# Patient Record
Sex: Female | Born: 1995 | Race: Black or African American | Hispanic: No | Marital: Single | Smoking: Never smoker
Health system: Southern US, Community
[De-identification: ages and names within clinical notes are randomized; demographics above are authoritative.]

## PROBLEM LIST (undated history)

## (undated) ENCOUNTER — Inpatient Hospital Stay (HOSPITAL_COMMUNITY): Payer: Self-pay

## (undated) DIAGNOSIS — E669 Obesity, unspecified: Secondary | ICD-10-CM

## (undated) DIAGNOSIS — J45909 Unspecified asthma, uncomplicated: Secondary | ICD-10-CM

## (undated) DIAGNOSIS — K219 Gastro-esophageal reflux disease without esophagitis: Secondary | ICD-10-CM

## (undated) DIAGNOSIS — IMO0002 Reserved for concepts with insufficient information to code with codable children: Secondary | ICD-10-CM

## (undated) DIAGNOSIS — K6289 Other specified diseases of anus and rectum: Secondary | ICD-10-CM

## (undated) DIAGNOSIS — E282 Polycystic ovarian syndrome: Secondary | ICD-10-CM

## (undated) DIAGNOSIS — K649 Unspecified hemorrhoids: Secondary | ICD-10-CM

## (undated) HISTORY — DX: Unspecified hemorrhoids: K64.9

## (undated) HISTORY — DX: Other specified diseases of anus and rectum: K62.89

## (undated) HISTORY — PX: NO PAST SURGERIES: SHX2092

## (undated) HISTORY — DX: Polycystic ovarian syndrome: E28.2

---
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
## 2014-05-04 ENCOUNTER — Encounter (HOSPITAL_COMMUNITY): Payer: Self-pay | Admitting: Emergency Medicine

## 2014-05-04 ENCOUNTER — Emergency Department (HOSPITAL_COMMUNITY)
Admission: EM | Admit: 2014-05-04 | Discharge: 2014-05-05 | Disposition: A | Discharge status: Home / Self Care | Payer: Medicaid Other | Attending: Emergency Medicine | Admitting: Emergency Medicine

## 2014-05-04 DIAGNOSIS — R110 Nausea: Secondary | ICD-10-CM | POA: Diagnosis not present

## 2014-05-04 DIAGNOSIS — J45909 Unspecified asthma, uncomplicated: Secondary | ICD-10-CM | POA: Insufficient documentation

## 2014-05-04 DIAGNOSIS — E669 Obesity, unspecified: Secondary | ICD-10-CM | POA: Diagnosis not present

## 2014-05-04 DIAGNOSIS — R1011 Right upper quadrant pain: Secondary | ICD-10-CM | POA: Insufficient documentation

## 2014-05-04 DIAGNOSIS — Z8719 Personal history of other diseases of the digestive system: Secondary | ICD-10-CM | POA: Insufficient documentation

## 2014-05-04 DIAGNOSIS — Z3201 Encounter for pregnancy test, result positive: Secondary | ICD-10-CM | POA: Insufficient documentation

## 2014-05-04 DIAGNOSIS — Z3490 Encounter for supervision of normal pregnancy, unspecified, unspecified trimester: Secondary | ICD-10-CM

## 2014-05-04 DIAGNOSIS — R1030 Lower abdominal pain, unspecified: Secondary | ICD-10-CM

## 2014-05-04 DIAGNOSIS — R1032 Left lower quadrant pain: Secondary | ICD-10-CM | POA: Insufficient documentation

## 2014-05-04 NOTE — ED Notes (Signed)
Pt is c/o lower abd pain that she states comes and goes  Pt states she had episodes of vomiting last week  Pt states she has felt like she needed to have bowel movements today but was only able to go 3 out of 6 times  Pt states the only time she feels relief is when she is sitting on the toilet  Pt states her stool is formed but only passing little bits at a time

## 2014-05-05 ENCOUNTER — Emergency Department (HOSPITAL_COMMUNITY): Payer: Medicaid Other

## 2014-05-05 LAB — URINALYSIS, ROUTINE W REFLEX MICROSCOPIC
BILIRUBIN URINE: NEGATIVE
GLUCOSE, UA: NEGATIVE mg/dL
Ketones, ur: NEGATIVE mg/dL
Leukocytes, UA: NEGATIVE
Nitrite: NEGATIVE
PH: 6 (ref 5.0–8.0)
Protein, ur: NEGATIVE mg/dL
SPECIFIC GRAVITY, URINE: 1.014 (ref 1.005–1.030)
Urobilinogen, UA: 0.2 mg/dL (ref 0.0–1.0)

## 2014-05-05 LAB — POC URINE PREG, ED: Preg Test, Ur: POSITIVE — AB

## 2014-05-05 LAB — WET PREP, GENITAL
CLUE CELLS WET PREP: NONE SEEN
TRICH WET PREP: NONE SEEN
Yeast Wet Prep HPF POC: NONE SEEN

## 2014-05-05 LAB — URINE MICROSCOPIC-ADD ON

## 2014-05-05 LAB — I-STAT BETA HCG BLOOD, ED (MC, WL, AP ONLY): I-stat hCG, quantitative: 147.6 m[IU]/mL — ABNORMAL HIGH (ref <5)

## 2014-05-05 MED ORDER — ACETAMINOPHEN 500 MG PO TABS
1000.0000 mg | ORAL_TABLET | Freq: Once | ORAL | Status: AC
  Administered 2014-05-05: 1000 mg via ORAL
  Filled 2014-05-05: qty 2

## 2014-05-05 NOTE — Discharge Instructions (Signed)
Abdominal Pain During Pregnancy °Belly (abdominal) pain is common during pregnancy. Most of the time, it is not a serious problem. Other times, it can be a sign that something is wrong with the pregnancy. Always tell your doctor if you have belly pain. °HOME CARE °Monitor your belly pain for any changes. The following actions may help you feel better: °· Do not have sex (intercourse) or put anything in your vagina until you feel better. °· Rest until your pain stops. °· Drink clear fluids if you feel sick to your stomach (nauseous). Do not eat solid food until you feel better. °· Only take medicine as told by your doctor. °· Keep all doctor visits as told. °GET HELP RIGHT AWAY IF:  °· You are bleeding, leaking fluid, or pieces of tissue come out of your vagina. °· You have more pain or cramping. °· You keep throwing up (vomiting). °· You have pain when you pee (urinate) or have blood in your pee. °· You have a fever. °· You do not feel your baby moving as much. °· You feel very weak or feel like passing out. °· You have trouble breathing, with or without belly pain. °· You have a very bad headache and belly pain. °· You have fluid leaking from your vagina and belly pain. °· You keep having watery poop (diarrhea). °· Your belly pain does not go away after resting, or the pain gets worse. °MAKE SURE YOU:  °· Understand these instructions. °· Will watch your condition. °· Will get help right away if you are not doing well or get worse. °Document Released: 05/15/2009 Document Revised: 01/27/2013 Document Reviewed: 12/24/2012 °ExitCare® Patient Information ©2015 [employer]. This information is not intended to replace advice given to you by your health care provider. Make sure you discuss any questions you have with your health care provider. ° °

## 2014-05-05 NOTE — ED Provider Notes (Signed)
CSN: [id number]     Arrival date & time 05/04/14  2330 History   First MD Initiated Contact with Patient 05/04/14 2344     Chief Complaint  Patient presents with  . Abdominal Pain     (Consider location/radiation/quality/duration/timing/severity/associated sxs/prior Treatment) Patient is a 18 y.o. female presenting with abdominal pain. The history is provided by the patient.  Abdominal Pain Pain location:  LLQ, RLQ and suprapubic Pain quality: cramping   Pain quality: no fullness and not gnawing   Pain radiates to:  Does not radiate Pain severity:  Mild Onset quality:  Gradual Duration:  1 week Timing:  Intermittent Progression:  Worsening Chronicity:  New Context: recent sexual activity   Context: not diet changes, not eating and not sick contacts   Relieved by: sitting on toilet. Worsened by:  Nothing tried Associated symptoms: nausea   Associated symptoms: no chest pain, no cough, no diarrhea, no fever, no shortness of breath and no vomiting     Past Medical History  Diagnosis Date  . Anal or rectal pain   . Hemorrhoids   . Asthma   . Obesity    History reviewed. No pertinent past surgical history. Family History  Problem Relation Age of Onset  . Hypertension Mother   . Hypertension Father   . Diabetes Father   . Hypertension Other   . Diabetes Other   . Cancer Other   . Heart failure Other    History  Substance Use Topics  . Smoking status: Never Smoker   . Smokeless tobacco: Never Used  . Alcohol Use: No   OB History    No data available     Review of Systems  Constitutional: Negative for fever.  Respiratory: Negative for cough and shortness of breath.   Cardiovascular: Negative for chest pain and leg swelling.  Gastrointestinal: Positive for nausea and abdominal pain. Negative for vomiting and diarrhea.  All other systems reviewed and are negative.     Allergies  Food  Home Medications   Prior to Admission medications   Medication Sig  Start Date End Date Taking? Authorizing Provider  ibuprofen (ADVIL,MOTRIN) 200 MG tablet Take 200 mg by mouth every 6 (six) hours as needed for moderate pain.   Yes Historical Provider, MD   BP 133/66 mmHg  Pulse 79  Temp(Src) 98.5 F (36.9 C) (Oral)  Resp 20  Ht 6' (1.829 m)  Wt 364 lb (165.109 kg)  BMI 49.36 kg/m2  SpO2 93%  LMP 03/19/2014 (Approximate) Physical Exam  Constitutional: She is oriented to person, place, and time. She appears well-developed and well-nourished. No distress.  HENT:  Head: Normocephalic and atraumatic.  Mouth/Throat: Oropharynx is clear and moist.  Eyes: EOM are normal. Pupils are equal, round, and reactive to light.  Neck: Normal range of motion. Neck supple.  Cardiovascular: Normal rate and regular rhythm.  Exam reveals no friction rub.   No murmur heard. Pulmonary/Chest: Effort normal and breath sounds normal. No respiratory distress. She has no wheezes. She has no rales.  Abdominal: Soft. She exhibits no distension. There is tenderness (mild, bilateral lower quadrants). There is no rebound.  Musculoskeletal: Normal range of motion. She exhibits no edema.  Neurological: She is alert and oriented to person, place, and time. No cranial nerve deficit. She exhibits normal muscle tone. Coordination normal.  Skin: No rash noted. She is not diaphoretic.  Nursing note and vitals reviewed.   ED Course  Procedures (including critical care time) Labs Review Labs Reviewed  WET PREP, GENITAL  GC/CHLAMYDIA PROBE AMP  URINALYSIS, ROUTINE W REFLEX MICROSCOPIC    Imaging Review [place] Ob Comp Less 14 Wks  05/05/2014   CLINICAL DATA:  Positive pregnancy test.  Lower abdominal pain.  EXAM: OBSTETRIC <14 WK US AND TRANSVAGINAL OB US  TECHNIQUE: Both transabdominal and transvaginal ultrasound examinations were performed for complete evaluation of the gestation as well as the maternal uterus, adnexal regions, and pelvic cul-de-sac. Transvaginal technique was performed  to assess early pregnancy.  COMPARISON:  None.  FINDINGS: Intrauterine gestational sac: None  Yolk sac:  No  Embryo:  No  Cardiac Activity: No  Heart Rate:  No bpm  Maternal uterus/adnexae:  Subchorionic hemorrhage: None  Right ovary: Normal  Left ovary: Normal  Other: Endometrium appears thickened measuring 24.7 mm. Mixed echogenicity and appearance.  Free fluid: :Moderate amount of free fluid identified within the pelvis.  IMPRESSION: 1. No intrauterine gestational sac, yolk sac, or fetal pole identified. Differential considerations include intrauterine pregnancy too early to be sonographically visualized, missed abortion, or ectopic pregnancy. Followup ultrasound is recommended in 10-14 days for further evaluation. 2. Abnormal thickening of the endometrium. In the setting of missed abortion this could represent retained products. Attention on followup imaging is advised. 3. Moderate free fluid noted within the pelvis.   Electronically Signed   By: Signa Kellaylor  Stroud M.D.   On: 05/05/2014 02:42   [place] Ob Transvaginal  05/05/2014   CLINICAL DATA:  Positive pregnancy test.  Lower abdominal pain.  EXAM: OBSTETRIC <14 WK US AND TRANSVAGINAL OB US  TECHNIQUE: Both transabdominal and transvaginal ultrasound examinations were performed for complete evaluation of the gestation as well as the maternal uterus, adnexal regions, and pelvic cul-de-sac. Transvaginal technique was performed to assess early pregnancy.  COMPARISON:  None.  FINDINGS: Intrauterine gestational sac: None  Yolk sac:  No  Embryo:  No  Cardiac Activity: No  Heart Rate:  No bpm  Maternal uterus/adnexae:  Subchorionic hemorrhage: None  Right ovary: Normal  Left ovary: Normal  Other: Endometrium appears thickened measuring 24.7 mm. Mixed echogenicity and appearance.  Free fluid: :Moderate amount of free fluid identified within the pelvis.  IMPRESSION: 1. No intrauterine gestational sac, yolk sac, or fetal pole identified. Differential considerations include  intrauterine pregnancy too early to be sonographically visualized, missed abortion, or ectopic pregnancy. Followup ultrasound is recommended in 10-14 days for further evaluation. 2. Abnormal thickening of the endometrium. In the setting of missed abortion this could represent retained products. Attention on followup imaging is advised. 3. Moderate free fluid noted within the pelvis.   Electronically Signed   By: Signa Kellaylor  Stroud M.D.   On: 05/05/2014 02:42     EKG Interpretation None      MDM   Final diagnoses:  Lower abdominal pain  Pregnancy    41F here with abdominal pain. Lower abdominal pain, cramping. Better with sitting down on the toilet. Doesn't always have a BM when she feels like she has to go, which is about 6x per day. No exacerbating factors. States urinary frequency, but no vaginal discharge/bleeding. LMP 1 month ago, is 1 week late at this time. No fevers, no vomiting. Was nauseated for past week. Is getting over a yeast infection. On exam, mild bilateral lower abdominal tenderness. Will start with pelvic and urine studies.  Tender diffusely on pelvic. Will US. Urine studies show she is pregnant. Beta HCG lower than threshold to see an IUP. Instructed to f/u with repeat hCG in 2 days.  Elwin Mocha, MD 05/05/14 0700

## 2014-05-05 NOTE — ED Notes (Signed)
Witnessed bedside ultrasound.

## 2014-05-05 NOTE — ED Notes (Signed)
Patient reports intermittent abdominal pain for a week.  States she has had some trouble with bowel movements today.  She says she had the urge to go at least 6 times, but only produced small amounts of stool 3 of those times.

## 2014-05-06 LAB — GC/CHLAMYDIA PROBE AMP
CT PROBE, AMP APTIMA: POSITIVE — AB
GC PROBE AMP APTIMA: NEGATIVE

## 2014-05-07 ENCOUNTER — Telehealth: Payer: Self-pay | Admitting: Emergency Medicine

## 2014-05-07 NOTE — Telephone Encounter (Signed)
Positive Chlamydia culture Chart sent to EDP for review 

## 2014-05-14 ENCOUNTER — Telehealth (HOSPITAL_BASED_OUTPATIENT_CLINIC_OR_DEPARTMENT_OTHER): Payer: Self-pay | Admitting: *Deleted

## 2014-06-06 LAB — OB RESULTS CONSOLE RPR: RPR: NONREACTIVE

## 2014-06-06 LAB — OB RESULTS CONSOLE RUBELLA ANTIBODY, IGM: Rubella: IMMUNE

## 2014-06-06 LAB — OB RESULTS CONSOLE GBS: STREP GROUP B AG: POSITIVE

## 2014-06-06 LAB — OB RESULTS CONSOLE HIV ANTIBODY (ROUTINE TESTING): HIV: NONREACTIVE

## 2014-06-06 LAB — OB RESULTS CONSOLE ANTIBODY SCREEN: ANTIBODY SCREEN: NEGATIVE

## 2014-06-06 LAB — OB RESULTS CONSOLE GC/CHLAMYDIA
Chlamydia: NEGATIVE
Gonorrhea: NEGATIVE

## 2014-06-06 LAB — OB RESULTS CONSOLE ABO/RH: RH TYPE: POSITIVE

## 2014-06-06 LAB — OB RESULTS CONSOLE HEPATITIS B SURFACE ANTIGEN: Hepatitis B Surface Ag: NEGATIVE

## 2014-06-10 NOTE — L&D Delivery Note (Signed)
Delivery Note At 5:22 PM a viable and healthy female was delivered via Vaginal, Spontaneous Delivery (Presentation: Right Occiput Anterior).  APGAR: 9, 9; weight P .   Placenta status: Intact, Spontaneous.  Cord: 3 vessels with the following complications: None.    Anesthesia: Epidural  Episiotomy:  none Lacerations:  L labial Suture Repair: 3.0 vicryl rapide Est. Blood Loss (mL):  352cc  Mom to postpartum.  Baby to Couplet care / Skin to Skin.  Bovard-Stuckert, Rhonda Hall 01/05/2015, 5:51 PM  Br/Bo; [place]; Tdap in [facility], O+; Contra POPs  Baby: Rhonda Hall

## 2014-06-18 ENCOUNTER — Encounter (HOSPITAL_COMMUNITY): Payer: Self-pay | Admitting: *Deleted

## 2014-06-18 ENCOUNTER — Inpatient Hospital Stay (HOSPITAL_COMMUNITY)
Admission: AD | Admit: 2014-06-18 | Discharge: 2014-06-18 | Disposition: A | Discharge status: Home / Self Care | Payer: Medicaid Other | Source: Ambulatory Visit | Attending: Obstetrics and Gynecology | Admitting: Obstetrics and Gynecology

## 2014-06-18 DIAGNOSIS — R42 Dizziness and giddiness: Secondary | ICD-10-CM

## 2014-06-18 DIAGNOSIS — Z3A11 11 weeks gestation of pregnancy: Secondary | ICD-10-CM | POA: Insufficient documentation

## 2014-06-18 DIAGNOSIS — O210 Mild hyperemesis gravidarum: Secondary | ICD-10-CM | POA: Insufficient documentation

## 2014-06-18 DIAGNOSIS — O219 Vomiting of pregnancy, unspecified: Secondary | ICD-10-CM

## 2014-06-18 LAB — URINALYSIS, ROUTINE W REFLEX MICROSCOPIC
Bilirubin Urine: NEGATIVE
GLUCOSE, UA: NEGATIVE mg/dL
HGB URINE DIPSTICK: NEGATIVE
Ketones, ur: NEGATIVE mg/dL
Nitrite: NEGATIVE
PH: 6.5 (ref 5.0–8.0)
Protein, ur: NEGATIVE mg/dL
Specific Gravity, Urine: 1.025 (ref 1.005–1.030)
UROBILINOGEN UA: 0.2 mg/dL (ref 0.0–1.0)

## 2014-06-18 LAB — COMPREHENSIVE METABOLIC PANEL
ALT: 15 U/L (ref 0–35)
AST: 17 U/L (ref 0–37)
Albumin: 3.2 g/dL — ABNORMAL LOW (ref 3.5–5.2)
Alkaline Phosphatase: 52 U/L (ref 39–117)
Anion gap: 7 (ref 5–15)
BUN: 8 mg/dL (ref 6–23)
CALCIUM: 8.8 mg/dL (ref 8.4–10.5)
CHLORIDE: 106 meq/L (ref 96–112)
CO2: 23 mmol/L (ref 19–32)
Creatinine, Ser: 0.54 mg/dL (ref 0.50–1.10)
GFR calc non Af Amer: >90 mL/min (ref >90)
GLUCOSE: 110 mg/dL — AB (ref 70–99)
POTASSIUM: 3.7 mmol/L (ref 3.5–5.1)
Sodium: 136 mmol/L (ref 135–145)
TOTAL PROTEIN: 6.5 g/dL (ref 6.0–8.3)
Total Bilirubin: 0.4 mg/dL (ref 0.3–1.2)

## 2014-06-18 LAB — CBC
HCT: 37 % (ref 36.0–46.0)
Hemoglobin: 11.8 g/dL — ABNORMAL LOW (ref 12.0–15.0)
MCH: 27.9 pg (ref 26.0–34.0)
MCHC: 31.9 g/dL (ref 30.0–36.0)
MCV: 87.5 fL (ref 78.0–100.0)
Platelets: 242 10*3/uL (ref 150–400)
RBC: 4.23 MIL/uL (ref 3.87–5.11)
RDW: 15.4 % (ref 11.5–15.5)
WBC: 9.1 10*3/uL (ref 4.0–10.5)

## 2014-06-18 LAB — URINE MICROSCOPIC-ADD ON

## 2014-06-18 LAB — POCT PREGNANCY, URINE: Preg Test, Ur: POSITIVE — AB

## 2014-06-18 MED ORDER — PROMETHAZINE HCL 25 MG PO TABS
25.0000 mg | ORAL_TABLET | Freq: Once | ORAL | Status: AC
  Administered 2014-06-18: 25 mg via ORAL
  Filled 2014-06-18: qty 1

## 2014-06-18 MED ORDER — PROMETHAZINE HCL 25 MG PO TABS: 25.0000 mg | ORAL_TABLET | Freq: Four times a day (QID) | ORAL | Status: DC | PRN

## 2014-06-18 NOTE — MAU Provider Note (Signed)
History     CSN: [id number]  Arrival date and time: 06/18/14 1255   First Provider Initiated Contact with Patient 06/18/14 1327      No chief complaint on file.  HPI Comments: Rhonda Hall 19 y.o. G1P0 5815w0d presents to MAU via EMS due to dizzy spell. She originally  felt it was due to low blood sugar but her mother brought her food and that did not make her better. She does have nausea, no vomiting. She has had an ultrasound in the office and everything is fine with pregnancy.      Past Medical History  Diagnosis Date  . Anal or rectal pain   . Hemorrhoids   . Asthma   . Obesity     Past Surgical History  Procedure Laterality Date  . No past surgeries      Family History  Problem Relation Age of Onset  . Hypertension Mother   . Hypertension Father   . Diabetes Father   . Hypertension Other   . Diabetes Other   . Cancer Other   . Heart failure Other     History  Substance Use Topics  . Smoking status: Never Smoker   . Smokeless tobacco: Never Used  . Alcohol Use: No    Allergies:  Allergies  Allergen Reactions  . Food Hives and Other (See Comments)    Pt is allergic to strawberries    Prescriptions prior to admission  Medication Sig Dispense Refill Last Dose  . amoxicillin (AMOXIL) 500 MG capsule Take 500 mg by mouth 3 (three) times daily.   06/17/2014 at Unknown time  . omeprazole (PRILOSEC OTC) 20 MG tablet Take 20 mg by mouth daily.   Past Week at Unknown time  . Prenatal Vit-Fe Fumarate-FA (MULTIVITAMIN-PRENATAL) 27-0.8 MG TABS tablet Take 1 tablet by mouth daily.    06/17/2014 at Unknown time  . ibuprofen (ADVIL,MOTRIN) 200 MG tablet Take 200 mg by mouth every 6 (six) hours as needed for moderate pain.   PRN    Review of Systems  Respiratory: Negative.   Cardiovascular: Negative.   Gastrointestinal: Positive for nausea. Negative for vomiting.  Genitourinary: Negative.   Musculoskeletal: Negative.   Neurological: Positive for dizziness and  weakness.   Physical Exam   Blood pressure 134/60, pulse 92, temperature 97.9 F (36.6 C), temperature source Oral, resp. rate 18, last menstrual period 04/02/2014.  Physical Exam  Constitutional: She is oriented to person, place, and time. She appears well-developed and well-nourished. No distress.  obese  HENT:  Head: Normocephalic and atraumatic.  Eyes: Pupils are equal, round, and reactive to light.  Cardiovascular: Normal rate, regular rhythm and normal heart sounds.   Respiratory: Effort normal and breath sounds normal. No respiratory distress. She has no wheezes. She has no rales.  GI: Soft. Bowel sounds are normal. She exhibits no distension. There is no tenderness. There is no rebound and no guarding.  Musculoskeletal: Normal range of motion.  Neurological: She is alert and oriented to person, place, and time.  Skin: Skin is warm and dry.  Psychiatric: She has a normal mood and affect. Her behavior is normal. Judgment and thought content normal.   Results for orders placed or performed during the hospital encounter of 06/18/14 (from the past 24 hour(s))  Urinalysis, Routine w reflex microscopic     Status: Abnormal   Collection Time: 06/18/14  1:10 PM  Result Value Ref Range   Color, Urine YELLOW YELLOW   APPearance CLEAR CLEAR  Specific Gravity, Urine 1.025 1.005 - 1.030   pH 6.5 5.0 - 8.0   Glucose, UA NEGATIVE NEGATIVE mg/dL   Hgb urine dipstick NEGATIVE NEGATIVE   Bilirubin Urine NEGATIVE NEGATIVE   Ketones, ur NEGATIVE NEGATIVE mg/dL   Protein, ur NEGATIVE NEGATIVE mg/dL   Urobilinogen, UA 0.2 0.0 - 1.0 mg/dL   Nitrite NEGATIVE NEGATIVE   Leukocytes, UA SMALL (A) NEGATIVE  Urine microscopic-add on     Status: Abnormal   Collection Time: 06/18/14  1:10 PM  Result Value Ref Range   Squamous Epithelial / LPF MANY (A) RARE   WBC, UA 3-6 <3 WBC/hpf   Bacteria, UA MANY (A) RARE   Urine-Other MUCOUS PRESENT   Pregnancy, urine POC     Status: Abnormal   Collection  Time: 06/18/14  1:33 PM  Result Value Ref Range   Preg Test, Ur POSITIVE (A) NEGATIVE  CBC     Status: Abnormal   Collection Time: 06/18/14  1:48 PM  Result Value Ref Range   WBC 9.1 4.0 - 10.5 K/uL   RBC 4.23 3.87 - 5.11 MIL/uL   Hemoglobin 11.8 (L) 12.0 - 15.0 g/dL   HCT 16.1 09.6 - 04.5 %   MCV 87.5 78.0 - 100.0 fL   MCH 27.9 26.0 - 34.0 pg   MCHC 31.9 30.0 - 36.0 g/dL   RDW 40.9 81.1 - 91.4 %   Platelets 242 150 - 400 K/uL  Comprehensive metabolic panel     Status: Abnormal   Collection Time: 06/18/14  1:48 PM  Result Value Ref Range   Sodium 136 135 - 145 mmol/L   Potassium 3.7 3.5 - 5.1 mmol/L   Chloride 106 96 - 112 mEq/L   CO2 23 19 - 32 mmol/L   Glucose, Bld 110 (H) 70 - 99 mg/dL   BUN 8 6 - 23 mg/dL   Creatinine, Ser 7.82 0.50 - 1.10 mg/dL   Calcium 8.8 8.4 - 95.6 mg/dL   Total Protein 6.5 6.0 - 8.3 g/dL   Albumin 3.2 (L) 3.5 - 5.2 g/dL   AST 17 0 - 37 U/L   ALT 15 0 - 35 U/L   Alkaline Phosphatase 52 39 - 117 U/L   Total Bilirubin 0.4 0.3 - 1.2 mg/dL   GFR calc non Af Amer >90 >90 mL/min   GFR calc Af Amer >90 >90 mL/min   Anion gap 7 5 - 15     MAU Course  Procedures  MDM CBC, CMET, urine culture Phenergan 25 mg po Reviewed case with Dr Ambrose Mantle who felt it was fine to send her home with phenergan Informal bedside ultrasound done due to obesity +FHT at 167  Assessment and Plan   A: Vertigo in early Pregnancy Nausea in pregnancy  P: Above orders Reviewed meal schedule to include 6 small meals per day/ protein in each meal Advised to consider seeing nutritionist Phenergan 25 mg po q8 hours prn nausea Follow up with Dr Danny Lawless as needed Return to MAU as needed  Carolynn Serve 06/18/2014, 2:06 PM

## 2014-06-18 NOTE — Discharge Instructions (Signed)
Dizziness °Dizziness is a common problem. It is a feeling of unsteadiness or light-headedness. You may feel like you are about to faint. Dizziness can lead to injury if you stumble or fall. A person of any age group can suffer from dizziness, but dizziness is more common in older adults. °CAUSES  °Dizziness can be caused by many different things, including: °· Middle ear problems. °· Standing for too long. °· Infections. °· An allergic reaction. °· Aging. °· An emotional response to something, such as the sight of blood. °· Side effects of medicines. °· Tiredness. °· Problems with circulation or blood pressure. °· Excessive use of alcohol or medicines, or illegal drug use. °· Breathing too fast (hyperventilation). °· An irregular heart rhythm (arrhythmia). °· A low red blood cell count (anemia). °· Pregnancy. °· Vomiting, diarrhea, fever, or other illnesses that cause body fluid loss (dehydration). °· Diseases or conditions such as Parkinson's disease, high blood pressure (hypertension), diabetes, and thyroid problems. °· Exposure to extreme heat. °DIAGNOSIS  °Your health care provider will ask about your symptoms, perform a physical exam, and perform an electrocardiogram (ECG) to record the electrical activity of your heart. Your health care provider may also perform other heart or blood tests to determine the cause of your dizziness. These may include: °· Transthoracic echocardiogram (TTE). During echocardiography, sound waves are used to evaluate how blood flows through your heart. °· Transesophageal echocardiogram (TEE). °· Cardiac monitoring. This allows your health care provider to monitor your heart rate and rhythm in real time. °· Holter monitor. This is a portable device that records your heartbeat and can help diagnose heart arrhythmias. It allows your health care provider to track your heart activity for several days if needed. °· Stress tests by exercise or by giving medicine that makes the heart beat  faster. °TREATMENT  °Treatment of dizziness depends on the cause of your symptoms and can vary greatly. °HOME CARE INSTRUCTIONS  °· Drink enough fluids to keep your urine clear or pale yellow. This is especially important in very hot weather. In older adults, it is also important in cold weather. °· Take your medicine exactly as directed if your dizziness is caused by medicines. When taking blood pressure medicines, it is especially important to get up slowly. °· Rise slowly from chairs and steady yourself until you feel okay. °· In the morning, first sit up on the side of the bed. When you feel okay, stand slowly while holding onto something until you know your balance is fine. °· Move your legs often if you need to stand in one place for a long time. Tighten and relax your muscles in your legs while standing. °· Have someone stay with you for 1-2 days if dizziness continues to be a problem. Do this until you feel you are well enough to stay alone. Have the person call your health care provider if he or she notices changes in you that are concerning. °· Do not drive or use heavy machinery if you feel dizzy. °· Do not drink alcohol. °SEEK IMMEDIATE MEDICAL CARE IF:  °· Your dizziness or light-headedness gets worse. °· You feel nauseous or vomit. °· You have problems talking, walking, or using your arms, hands, or legs. °· You feel weak. °· You are not thinking clearly or you have trouble forming sentences. It may take a friend or family member to notice this. °· You have chest pain, abdominal pain, shortness of breath, or sweating. °· Your vision changes. °· You notice   any bleeding.  You have side effects from medicine that seems to be getting worse rather than better. MAKE SURE YOU:   Understand these instructions.  Will watch your condition.  Will get help right away if you are not doing well or get worse. Document Released: 11/20/2000 Document Revised: 06/01/2013 Document Reviewed: 12/14/2010 [facility]  Patient Information 2015 [employer], [place]. This information is not intended to replace advice given to you by your health care provider. Make sure you discuss any questions you have with your health care provider. Morning Sickness Morning sickness is when you feel sick to your stomach (nauseous) during pregnancy. This nauseous feeling may or may not come with vomiting. It often occurs in the morning but can be a problem any time of day. Morning sickness is most common during the first trimester, but it may continue throughout pregnancy. While morning sickness is unpleasant, it is usually harmless unless you develop severe and continual vomiting (hyperemesis gravidarum). This condition requires more intense treatment.  CAUSES  The cause of morning sickness is not completely known but seems to be related to normal hormonal changes that occur in pregnancy. RISK FACTORS You are at greater risk if you:  Experienced nausea or vomiting before your pregnancy.  Had morning sickness during a previous pregnancy.  Are pregnant with more than one baby, such as twins. TREATMENT  Do not use any medicines (prescription, over-the-counter, or herbal) for morning sickness without first talking to your health care provider. Your health care provider may prescribe or recommend:  Vitamin B6 supplements.  Anti-nausea medicines.  The herbal medicine ginger. HOME CARE INSTRUCTIONS   Only take over-the-counter or prescription medicines as directed by your health care provider.  Taking multivitamins before getting pregnant can prevent or decrease the severity of morning sickness in most women.  Eat a piece of dry toast or unsalted crackers before getting out of bed in the morning.  Eat five or six small meals a day.  Eat dry and bland foods (rice, baked potato). Foods high in carbohydrates are often helpful.  Do not drink liquids with your meals. Drink liquids between meals.  Avoid greasy, fatty, and spicy  foods.  Get someone to cook for you if the smell of any food causes nausea and vomiting.  If you feel nauseous after taking prenatal vitamins, take the vitamins at night or with a snack.  Snack on protein foods (nuts, yogurt, cheese) between meals if you are hungry.  Eat unsweetened gelatins for desserts.  Wearing an acupressure wristband (worn for sea sickness) may be helpful.  Acupuncture may be helpful.  Do not smoke.  Get a humidifier to keep the air in your house free of odors.  Get plenty of fresh air. SEEK MEDICAL CARE IF:   Your home remedies are not working, and you need medicine.  You feel dizzy or lightheaded.  You are losing weight. SEEK IMMEDIATE MEDICAL CARE IF:   You have persistent and uncontrolled nausea and vomiting.  You pass out (faint). MAKE SURE YOU:  Understand these instructions.  Will watch your condition.  Will get help right away if you are not doing well or get worse. Document Released: 07/18/2006 Document Revised: 06/01/2013 Document Reviewed: 11/11/2012 [facility]Exit[facility] Patient Information 2015 [place], [place]. This information is not intended to replace advice given to you by your health care provider. Make sure you discuss any questions you have with your health care provider.

## 2014-06-18 NOTE — MAU Note (Signed)
Pt. States that this am she felt weak like her blood sugar was dropping so she layed down and ate. This did not help. Felt worse after eating. Pt. States legs are weak and feel like they are going to give out. Denies history of diabetes. Next appointment scheduled with OB on Jan 25 for a second ultrasound. Denies bleeding. Reports normal discharge. Denies cramping.

## 2014-06-20 LAB — CULTURE, OB URINE
Colony Count: 95000
SPECIAL REQUESTS: NORMAL

## 2014-07-14 ENCOUNTER — Inpatient Hospital Stay (HOSPITAL_COMMUNITY)
Admission: AD | Admit: 2014-07-14 | Discharge: 2014-07-14 | Disposition: A | Discharge status: Home / Self Care | Payer: Medicaid Other | Source: Ambulatory Visit | Attending: Obstetrics and Gynecology | Admitting: Obstetrics and Gynecology

## 2014-07-14 ENCOUNTER — Encounter (HOSPITAL_COMMUNITY): Payer: Self-pay | Admitting: *Deleted

## 2014-07-14 DIAGNOSIS — Z3A14 14 weeks gestation of pregnancy: Secondary | ICD-10-CM | POA: Diagnosis not present

## 2014-07-14 DIAGNOSIS — Z3492 Encounter for supervision of normal pregnancy, unspecified, second trimester: Secondary | ICD-10-CM

## 2014-07-14 DIAGNOSIS — O368120 Decreased fetal movements, second trimester, not applicable or unspecified: Secondary | ICD-10-CM | POA: Insufficient documentation

## 2014-07-14 HISTORY — DX: Gastro-esophageal reflux disease without esophagitis: K21.9

## 2014-07-14 NOTE — MAU Note (Signed)
Pt stated she has not felt baby move today. Denies any pain or cramping.

## 2014-07-14 NOTE — MAU Provider Note (Signed)
  History     CSN: [id number]  Arrival date and time: 07/14/14 [id number]   First Provider Initiated Contact with Patient 07/14/14 1950      Chief Complaint  Patient presents with  . Decreased Fetal Movement   HPI  Pt is a 19 yo G1P0 at 1342w2d wks IUP here with report of not feeling baby move today.  Denies vaginal bleeding or pelvic pain.  Last movement felt per patient was yesterday.    Past Medical History  Diagnosis Date  . Anal or rectal pain   . Hemorrhoids   . Asthma   . Obesity   . GERD (gastroesophageal reflux disease)     Past Surgical History  Procedure Laterality Date  . No past surgeries      Family History  Problem Relation Age of Onset  . Hypertension Mother   . Hypertension Father   . Diabetes Father   . Hypertension Other   . Diabetes Other   . Cancer Other   . Heart failure Other     History  Substance Use Topics  . Smoking status: Never Smoker   . Smokeless tobacco: Never Used  . Alcohol Use: No    Allergies:  Allergies  Allergen Reactions  . 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 Strawberry Hives    Prescriptions prior to admission  Medication Sig Dispense Refill Last Dose  . Prenatal Vit-Fe Fumarate-FA (MULTIVITAMIN-PRENATAL) 27-0.8 MG TABS tablet Take 1 tablet by mouth daily.    07/13/2014 at Unknown time  . promethazine (PHENERGAN) 25 MG tablet Take 1 tablet (25 mg total) by mouth every 6 (six) hours as needed for nausea or vomiting. 30 tablet 0 07/13/2014 at Unknown time  . ibuprofen (ADVIL,MOTRIN) 200 MG tablet Take 200 mg by mouth every 6 (six) hours as needed for moderate pain.   More than a month at Unknown time  . omeprazole (PRILOSEC OTC) 20 MG tablet Take 20 mg by mouth daily.   More than a month at Unknown time    Review of Systems  Constitutional:       Decreased fetal movement  All other systems reviewed and are negative.    Physical Exam   Blood pressure 131/66, pulse 89, temperature 98.6  F (37 C), temperature source Oral, resp. rate 18, height 6' (1.829 m), weight 175.202 kg (386 lb 4 oz), last menstrual period 04/02/2014, SpO2 100 %.  Physical Exam  Constitutional: She is oriented to person, place, and time. She appears well-developed and well-nourished. No distress.  HENT:  Head: Normocephalic.  Neck: Normal range of motion. Neck supple.  Cardiovascular: Normal rate, regular rhythm and normal heart sounds.   Respiratory: Effort normal and breath sounds normal.  GI: Soft. There is no tenderness.  Genitourinary: No bleeding in the vagina.  Neurological: She is alert and oriented to person, place, and time.  Skin: Skin is warm and dry.   FHR 140's MAU Course  Procedures   Assessment and Plan  19 yo G1P0 at 4642w2d wks IUP Reassuring Fetal Heart Tones  Plan: Discharge to home Provided reassurance Talked about the milestones in pregnancy - expected fluttering Keep scheduled appointment on 08/01/14.    Marlis EdelsonKARIM, Nikitas Davtyan N

## 2014-07-14 NOTE — MAU Note (Signed)
PT SAYS SHE   HAD BEEN FEELING  BABY MOVE-  BUT  NO MOVEMENT  TODAY.    NO VAG BLEEDING.PNC-   WITH DR BOVARD-  SEEN  ON 1-25  AND  NEXT APPOINTMENT   IS 2-22.  LAST SEX-    YESTERDAY.

## 2014-11-03 ENCOUNTER — Encounter (HOSPITAL_COMMUNITY): Payer: Self-pay | Admitting: Emergency Medicine

## 2014-11-03 ENCOUNTER — Emergency Department (HOSPITAL_COMMUNITY)
Admission: EM | Admit: 2014-11-03 | Discharge: 2014-11-03 | Disposition: A | Discharge status: Home / Self Care | Payer: Medicaid Other | Attending: Emergency Medicine | Admitting: Emergency Medicine

## 2014-11-03 DIAGNOSIS — J029 Acute pharyngitis, unspecified: Secondary | ICD-10-CM | POA: Diagnosis not present

## 2014-11-03 DIAGNOSIS — E669 Obesity, unspecified: Secondary | ICD-10-CM | POA: Diagnosis not present

## 2014-11-03 DIAGNOSIS — J45909 Unspecified asthma, uncomplicated: Secondary | ICD-10-CM | POA: Diagnosis not present

## 2014-11-03 DIAGNOSIS — Z79899 Other long term (current) drug therapy: Secondary | ICD-10-CM | POA: Insufficient documentation

## 2014-11-03 DIAGNOSIS — Z8719 Personal history of other diseases of the digestive system: Secondary | ICD-10-CM | POA: Diagnosis not present

## 2014-11-03 LAB — RAPID STREP SCREEN (MED CTR MEBANE ONLY): STREPTOCOCCUS, GROUP A SCREEN (DIRECT): NEGATIVE

## 2014-11-03 NOTE — ED Notes (Signed)
Pt states she woke up yesterday morning and her throat felt scratchy   Pt states today it is worse  Pt states it is painful to swallow  Pt's throat is red

## 2014-11-03 NOTE — ED Provider Notes (Signed)
CSN: [id number]     Arrival date & time 11/03/14  0217 History   First MD Initiated Contact with Patient 11/03/14 [phone]     Chief Complaint  Patient presents with  . Sore Throat     (Consider location/radiation/quality/duration/timing/severity/associated sxs/prior Treatment) Patient is a 19 y.o. female presenting with pharyngitis. The history is provided by the patient. No language interpreter was used.  Sore Throat This is a new problem. The current episode started today. Associated symptoms include a sore throat. Pertinent negatives include no chills, congestion, coughing or fever. Associated symptoms comments: Sore throat without fever for the past one day. No significant congestion, cough, or sinus symptoms. She reports brother and mother with similar symptoms. .    Past Medical History  Diagnosis Date  . Anal or rectal pain   . Hemorrhoids   . Asthma   . Obesity   . GERD (gastroesophageal reflux disease)    Past Surgical History  Procedure Laterality Date  . No past surgeries     Family History  Problem Relation Age of Onset  . Hypertension Mother   . Hypertension Father   . Diabetes Father   . Hypertension Other   . Diabetes Other   . Cancer Other   . Heart failure Other    History  Substance Use Topics  . Smoking status: Never Smoker   . Smokeless tobacco: Never Used  . Alcohol Use: No   OB History    Gravida Para Term Preterm AB TAB SAB Ectopic Multiple Living   1              Review of Systems  Constitutional: Negative for fever and chills.  HENT: Positive for sore throat. Negative for congestion and trouble swallowing.   Respiratory: Negative.  Negative for cough.   Cardiovascular: Negative.   Gastrointestinal: Negative.   Musculoskeletal: Negative.   Skin: Negative.       Allergies  Strawberry  Home Medications   Prior to Admission medications   Medication Sig Start Date End Date Taking? Authorizing Provider  acetaminophen (TYLENOL) 325 MG  tablet Take 650 mg by mouth every 6 (six) hours as needed for headache.   Yes Historical Provider, MD  docusate sodium (COLACE) 100 MG capsule Take 100 mg by mouth 2 (two) times daily as needed for mild constipation.   Yes Historical Provider, MD  Prenatal Vit-Fe Fumarate-FA (MULTIVITAMIN-PRENATAL) 27-0.8 MG TABS tablet Take 1 tablet by mouth daily.    Yes Historical Provider, MD  albuterol (PROVENTIL HFA;VENTOLIN HFA) 108 (90 BASE) MCG/ACT inhaler Inhale into the lungs every 6 (six) hours as needed for wheezing or shortness of breath.    Historical Provider, MD  promethazine (PHENERGAN) 25 MG tablet Take 1 tablet (25 mg total) by mouth every 6 (six) hours as needed for nausea or vomiting. 06/18/14   Delbert PhenixLinda M Barefoot, NP   BP 134/66 mmHg  Pulse 114  Temp(Src) 98.6 F (37 C) (Oral)  Resp 16  SpO2 99%  LMP 04/02/2014 (Exact Date) Physical Exam  Constitutional: She is oriented to person, place, and time. She appears well-developed and well-nourished.  HENT:  Right Ear: External ear normal.  Left Ear: External ear normal.  Nose: No mucosal edema.  Mouth/Throat: Oropharynx is clear and moist.  Neck: Normal range of motion.  Pulmonary/Chest: Effort normal.  Abdominal: There is no tenderness.  Lymphadenopathy:    She has no cervical adenopathy.  Neurological: She is alert and oriented to person, place, and time.  Skin:  Skin is warm and dry.    ED Course  Procedures (including critical care time) Labs Review Labs Reviewed  RAPID STREP SCREEN (NOT AT [facility])  CULTURE, GROUP A STREP    Imaging Review No results found.   EKG Interpretation None      MDM   Final diagnoses:  None    1. Pharyngitis  Well appearing patient with viral sore throat requiring supportive care only.    Elpidio Anis, PA-C 11/03/14 1610  Derwood Kaplan, MD 11/04/14 [phone]

## 2014-11-03 NOTE — Discharge Instructions (Signed)
Pharyngitis °Pharyngitis is redness, pain, and swelling (inflammation) of your pharynx.  °CAUSES  °Pharyngitis is usually caused by infection. Most of the time, these infections are from viruses (viral) and are part of a cold. However, sometimes pharyngitis is caused by bacteria (bacterial). Pharyngitis can also be caused by allergies. Viral pharyngitis may be spread from person to person by coughing, sneezing, and personal items or utensils (cups, forks, spoons, toothbrushes). Bacterial pharyngitis may be spread from person to person by more intimate contact, such as kissing.  °SIGNS AND SYMPTOMS  °Symptoms of pharyngitis include:   °· Sore throat.   °· Tiredness (fatigue).   °· Low-grade fever.   °· Headache. °· Joint pain and muscle aches. °· Skin rashes. °· Swollen lymph nodes. °· Plaque-like film on throat or tonsils (often seen with bacterial pharyngitis). °DIAGNOSIS  °Your health care provider will ask you questions about your illness and your symptoms. Your medical history, along with a physical exam, is often all that is needed to diagnose pharyngitis. Sometimes, a rapid strep test is done. Other lab tests may also be done, depending on the suspected cause.  °TREATMENT  °Viral pharyngitis will usually get better in 3-4 days without the use of medicine. Bacterial pharyngitis is treated with medicines that kill germs (antibiotics).  °HOME CARE INSTRUCTIONS  °· Drink enough water and fluids to keep your urine clear or pale yellow.   °· Only take over-the-counter or prescription medicines as directed by your health care provider:   °¨ If you are prescribed antibiotics, make sure you finish them even if you start to feel better.   °¨ Do not take aspirin.   °· Get lots of rest.   °· Gargle with 8 oz of salt water (½ tsp of salt per 1 qt of water) as often as every 1-2 hours to soothe your throat.   °· Throat lozenges (if you are not at risk for choking) or sprays may be used to soothe your throat. °SEEK MEDICAL  CARE IF:  °· You have large, tender lumps in your neck. °· You have a rash. °· You cough up green, yellow-brown, or bloody spit. °SEEK IMMEDIATE MEDICAL CARE IF:  °· Your neck becomes stiff. °· You drool or are unable to swallow liquids. °· You vomit or are unable to keep medicines or liquids down. °· You have severe pain that does not go away with the use of recommended medicines. °· You have trouble breathing (not caused by a stuffy nose). °MAKE SURE YOU:  °· Understand these instructions. °· Will watch your condition. °· Will get help right away if you are not doing well or get worse. °Document Released: 05/27/2005 Document Revised: 03/17/2013 Document Reviewed: 02/01/2013 °Exit[employer]® Patient Information ©2015 [employer]. This information is not intended to replace advice given to you by your health care provider. Make sure you discuss any questions you have with your health care provider. ° °Salt Water Gargle °This solution will help make your mouth and throat feel better. °HOME CARE INSTRUCTIONS  °· Mix 1 teaspoon of salt in 8 ounces of warm water. °· Gargle with this solution as much or often as you need or as directed. Swish and gargle gently if you have any sores or wounds in your mouth. °· Do not swallow this mixture. °Document Released: 02/29/2004 Document Revised: 08/19/2011 Document Reviewed: 07/22/2008 °Exit[employer]® Patient Information ©2015 [employer]. This information is not intended to replace advice given to you by your health care provider. Make sure you discuss any questions you have with your health care provider. ° °

## 2014-11-05 LAB — CULTURE, GROUP A STREP: Strep A Culture: NEGATIVE

## 2014-11-10 ENCOUNTER — Encounter (HOSPITAL_COMMUNITY): Payer: Self-pay | Admitting: Emergency Medicine

## 2014-11-10 ENCOUNTER — Emergency Department (INDEPENDENT_AMBULATORY_CARE_PROVIDER_SITE_OTHER)
Admission: EM | Admit: 2014-11-10 | Discharge: 2014-11-10 | Disposition: A | Discharge status: Home / Self Care | Payer: Medicaid Other | Source: Home / Self Care | Attending: Family Medicine | Admitting: Family Medicine

## 2014-11-10 DIAGNOSIS — H109 Unspecified conjunctivitis: Secondary | ICD-10-CM

## 2014-11-10 MED ORDER — OLOPATADINE HCL 0.2 % OP SOLN: 1.0000 [drp] | Freq: Every day | OPHTHALMIC | Status: DC

## 2014-11-10 NOTE — Discharge Instructions (Signed)
You have developed pinkeye otherwise known as conjunctivitis. Please start the Pataday drops for this. If this medicine is expensive you may use over-the-counter Zaditor. Please also start a daily allergy pill such as Zyrtec for additional relief. This should go away in another 1-3 days.

## 2014-11-10 NOTE — ED Notes (Signed)
Right eye red, onset Monday.

## 2014-11-10 NOTE — ED Provider Notes (Signed)
CSN: [id number]     Arrival date & time 11/10/14  1516 History   First MD Initiated Contact with Patient 11/10/14 1616     Chief Complaint  Patient presents with  . Conjunctivitis   (Consider location/radiation/quality/duration/timing/severity/associated sxs/prior Treatment) HPI  Pinkeye. Started 4 days ago. Right eye only. Patient treated recently for cold type symptoms and sore throat. Watery. Worse after waking up. Symptoms are constant. Slightly itchy. No change in vision. Nonpainful. Tried over-the-counter drops without improvement. Finesse, fevers, sore throat, chest pain, headache, nausea, vomiting, diarrhea, constipation, abdominal pain.  Past Medical History  Diagnosis Date  . Anal or rectal pain   . Hemorrhoids   . Asthma   . Obesity   . GERD (gastroesophageal reflux disease)    Past Surgical History  Procedure Laterality Date  . No past surgeries     Family History  Problem Relation Age of Onset  . Hypertension Mother   . Hypertension Father   . Diabetes Father   . Hypertension Other   . Diabetes Other   . Cancer Other   . Heart failure Other    History  Substance Use Topics  . Smoking status: Never Smoker   . Smokeless tobacco: Never Used  . Alcohol Use: No   OB History    Gravida Para Term Preterm AB TAB SAB Ectopic Multiple Living   1              Review of Systems Per HPI with all other pertinent systems negative.   Allergies  Strawberry  Home Medications   Prior to Admission medications   Medication Sig Start Date End Date Taking? Authorizing Provider  acetaminophen (TYLENOL) 325 MG tablet Take 650 mg by mouth every 6 (six) hours as needed for headache.    Historical Provider, MD  albuterol (PROVENTIL HFA;VENTOLIN HFA) 108 (90 BASE) MCG/ACT inhaler Inhale into the lungs every 6 (six) hours as needed for wheezing or shortness of breath.    Historical Provider, MD  docusate sodium (COLACE) 100 MG capsule Take 100 mg by mouth 2 (two) times daily as  needed for mild constipation.    Historical Provider, MD  Olopatadine HCl 0.2 % SOLN Apply 1 drop to eye daily. 11/10/14   Ozella Rocksavid J Satish Hammers, MD  Prenatal Vit-Fe Fumarate-FA (MULTIVITAMIN-PRENATAL) 27-0.8 MG TABS tablet Take 1 tablet by mouth daily.     Historical Provider, MD  promethazine (PHENERGAN) 25 MG tablet Take 1 tablet (25 mg total) by mouth every 6 (six) hours as needed for nausea or vomiting. 06/18/14   Delbert PhenixLinda M Barefoot, NP   BP 143/86 mmHg  Pulse 90  Temp(Src) 98.7 F (37.1 C) (Oral)  Resp 20  SpO2 98%  LMP 04/02/2014 (Exact Date) Physical Exam Physical Exam  Constitutional: oriented to person, place, and time. appears well-developed and well-nourished. No distress.  HENT:  Right eye conjunctival injection, plentiful tear production. No purulence, no lid swelling.  Head: Normocephalic and atraumatic.  Eyes: EOMI. PERRL.  Neck: Normal range of motion.  Cardiovascular: RRR, no m/r/g, 2+ distal pulses,  Pulmonary/Chest: Effort normal and breath sounds normal. No respiratory distress.  Abdominal: Soft. Bowel sounds are normal. NonTTP, no distension.  Musculoskeletal: Normal range of motion. Non ttp, no effusion.  Neurological: alert and oriented to person, place, and time.  Skin: Skin is warm. No rash noted. non diaphoretic.  Psychiatric: normal mood and affect. behavior is normal. Judgment and thought content normal.   ED Course  Procedures (including critical care time) Labs  Review Labs Reviewed - No data to display  Imaging Review No results found.   MDM   1. Conjunctivitis of right eye    Viral versus allergic. Start Secretary/administrator, start Zyrtec. Discussed likely progression of illness and reasons for return visit.    Ozella Rocks, MD 11/10/14 [phone]

## 2014-11-14 ENCOUNTER — Emergency Department (HOSPITAL_COMMUNITY)
Admission: EM | Admit: 2014-11-14 | Discharge: 2014-11-14 | Disposition: A | Discharge status: Home / Self Care | Payer: Medicaid Other | Attending: Emergency Medicine | Admitting: Emergency Medicine

## 2014-11-14 ENCOUNTER — Encounter (HOSPITAL_COMMUNITY): Payer: Self-pay | Admitting: Emergency Medicine

## 2014-11-14 DIAGNOSIS — Z3A32 32 weeks gestation of pregnancy: Secondary | ICD-10-CM | POA: Diagnosis not present

## 2014-11-14 DIAGNOSIS — O9989 Other specified diseases and conditions complicating pregnancy, childbirth and the puerperium: Secondary | ICD-10-CM | POA: Diagnosis not present

## 2014-11-14 DIAGNOSIS — O99213 Obesity complicating pregnancy, third trimester: Secondary | ICD-10-CM | POA: Insufficient documentation

## 2014-11-14 DIAGNOSIS — O99513 Diseases of the respiratory system complicating pregnancy, third trimester: Secondary | ICD-10-CM | POA: Insufficient documentation

## 2014-11-14 DIAGNOSIS — R1110 Vomiting, unspecified: Secondary | ICD-10-CM

## 2014-11-14 DIAGNOSIS — R531 Weakness: Secondary | ICD-10-CM | POA: Diagnosis not present

## 2014-11-14 DIAGNOSIS — O212 Late vomiting of pregnancy: Secondary | ICD-10-CM | POA: Insufficient documentation

## 2014-11-14 DIAGNOSIS — J45909 Unspecified asthma, uncomplicated: Secondary | ICD-10-CM | POA: Diagnosis not present

## 2014-11-14 DIAGNOSIS — Z79899 Other long term (current) drug therapy: Secondary | ICD-10-CM | POA: Insufficient documentation

## 2014-11-14 DIAGNOSIS — J029 Acute pharyngitis, unspecified: Secondary | ICD-10-CM | POA: Insufficient documentation

## 2014-11-14 LAB — CBC WITH DIFFERENTIAL/PLATELET
Basophils Absolute: 0 10*3/uL (ref 0.0–0.1)
Basophils Relative: 0 % (ref 0–1)
EOS ABS: 0 10*3/uL (ref 0.0–0.7)
Eosinophils Relative: 0 % (ref 0–5)
HCT: 39.5 % (ref 36.0–46.0)
HEMOGLOBIN: 12.5 g/dL (ref 12.0–15.0)
Lymphocytes Relative: 18 % (ref 12–46)
Lymphs Abs: 1.9 10*3/uL (ref 0.7–4.0)
MCH: 28.5 pg (ref 26.0–34.0)
MCHC: 31.6 g/dL (ref 30.0–36.0)
MCV: 90 fL (ref 78.0–100.0)
MONO ABS: 0.7 10*3/uL (ref 0.1–1.0)
MONOS PCT: 7 % (ref 3–12)
Neutro Abs: 7.9 10*3/uL — ABNORMAL HIGH (ref 1.7–7.7)
Neutrophils Relative %: 75 % (ref 43–77)
Platelets: 218 10*3/uL (ref 150–400)
RBC: 4.39 MIL/uL (ref 3.87–5.11)
RDW: 14.2 % (ref 11.5–15.5)
WBC: 10.6 10*3/uL — ABNORMAL HIGH (ref 4.0–10.5)

## 2014-11-14 LAB — COMPREHENSIVE METABOLIC PANEL
ALT: 14 U/L (ref 14–54)
AST: 18 U/L (ref 15–41)
Albumin: 2.8 g/dL — ABNORMAL LOW (ref 3.5–5.0)
Alkaline Phosphatase: 80 U/L (ref 38–126)
Anion gap: 8 (ref 5–15)
BUN: 5 mg/dL — ABNORMAL LOW (ref 6–20)
CHLORIDE: 107 mmol/L (ref 101–111)
CO2: 22 mmol/L (ref 22–32)
CREATININE: 0.47 mg/dL (ref 0.44–1.00)
Calcium: 9 mg/dL (ref 8.9–10.3)
GFR calc non Af Amer: >60 mL/min (ref >60)
GLUCOSE: 99 mg/dL (ref 65–99)
POTASSIUM: 3.5 mmol/L (ref 3.5–5.1)
Sodium: 137 mmol/L (ref 135–145)
Total Bilirubin: 0.3 mg/dL (ref 0.3–1.2)
Total Protein: 6.4 g/dL — ABNORMAL LOW (ref 6.5–8.1)

## 2014-11-14 LAB — PROTIME-INR
INR: 1 (ref 0.00–1.49)
PROTHROMBIN TIME: 13.4 s (ref 11.6–15.2)

## 2014-11-14 LAB — LIPASE, BLOOD: Lipase: 13 U/L — ABNORMAL LOW (ref 22–51)

## 2014-11-14 MED ORDER — OMEPRAZOLE 20 MG PO CPDR: 40.0000 mg | DELAYED_RELEASE_CAPSULE | Freq: Every day | ORAL | Status: AC

## 2014-11-14 NOTE — ED Provider Notes (Signed)
CSN: [id number]     Arrival date & time 11/14/14  1037 History   First MD Initiated Contact with Patient 11/14/14 1111     Chief Complaint  Patient presents with  . Hematemesis     (Consider location/radiation/quality/duration/timing/severity/associated sxs/prior Treatment) HPI  19 year old female who is about 8 months pregnant presents after vomiting. She states she woke up nauseated when she vomited there were streaks of bright red blood. The patient denies any abdominal pain. She's been feeling sick and weak for 2 weeks including cough, congestion, and overall weakness. She's also been having a sore throat. The patient denies current nausea, pain, or lightheadedness. She feels overall weak but otherwise feels at her baseline. There've been no issues with this pregnancy otherwise. No prior history of peptic ulcers and no NSAID or aspirin use. No hematochezia or melanoma.  Past Medical History  Diagnosis Date  . Anal or rectal pain   . Hemorrhoids   . Asthma   . Obesity   . GERD (gastroesophageal reflux disease)    Past Surgical History  Procedure Laterality Date  . No past surgeries     Family History  Problem Relation Age of Onset  . Hypertension Mother   . Hypertension Father   . Diabetes Father   . Hypertension Other   . Diabetes Other   . Cancer Other   . Heart failure Other    History  Substance Use Topics  . Smoking status: Never Smoker   . Smokeless tobacco: Never Used  . Alcohol Use: No   OB History    Gravida Para Term Preterm AB TAB SAB Ectopic Multiple Living   1              Review of Systems  HENT: Positive for congestion and sore throat.   Respiratory: Positive for cough.   Gastrointestinal: Positive for vomiting. Negative for abdominal pain, diarrhea and blood in stool.  Neurological: Positive for weakness.  All other systems reviewed and are negative.     Allergies  Strawberry  Home Medications   Prior to Admission medications    Medication Sig Start Date End Date Taking? Authorizing Provider  acetaminophen (TYLENOL) 325 MG tablet Take 650 mg by mouth every 6 (six) hours as needed for headache.    Historical Provider, MD  albuterol (PROVENTIL HFA;VENTOLIN HFA) 108 (90 BASE) MCG/ACT inhaler Inhale into the lungs every 6 (six) hours as needed for wheezing or shortness of breath.    Historical Provider, MD  docusate sodium (COLACE) 100 MG capsule Take 100 mg by mouth 2 (two) times daily as needed for mild constipation.    Historical Provider, MD  Olopatadine HCl 0.2 % SOLN Apply 1 drop to eye daily. 11/10/14   Ozella Rocks, MD  Prenatal Vit-Fe Fumarate-FA (MULTIVITAMIN-PRENATAL) 27-0.8 MG TABS tablet Take 1 tablet by mouth daily.     Historical Provider, MD  promethazine (PHENERGAN) 25 MG tablet Take 1 tablet (25 mg total) by mouth every 6 (six) hours as needed for nausea or vomiting. 06/18/14   Delbert Phenix, NP   BP 148/63 mmHg  Pulse 97  Temp(Src) 98.2 F (36.8 C) (Oral)  Resp 18  SpO2 98%  LMP 04/02/2014 (Exact Date) Physical Exam  Constitutional: She is oriented to person, place, and time. She appears well-developed and well-nourished.  Morbidly obese In no distress, currently on a phone call  HENT:  Head: Normocephalic and atraumatic.  Right Ear: External ear normal.  Left Ear: External ear normal.  Nose: Nose normal.  Eyes: Right eye exhibits no discharge. Left eye exhibits no discharge.  Cardiovascular: Normal rate, regular rhythm and normal heart sounds.   Pulmonary/Chest: Effort normal and breath sounds normal.  Abdominal: Soft. There is no tenderness.  gravid  Neurological: She is alert and oriented to person, place, and time.  Skin: Skin is warm and dry.  Nursing note and vitals reviewed.   ED Course  Procedures (including critical care time) Labs Review Labs Reviewed  CBC WITH DIFFERENTIAL/PLATELET - Abnormal; Notable for the following:    WBC 10.6 (*)    Neutro Abs 7.9 (*)    All other  components within normal limits  COMPREHENSIVE METABOLIC PANEL - Abnormal; Notable for the following:    BUN 5 (*)    Total Protein 6.4 (*)    Albumin 2.8 (*)    All other components within normal limits  LIPASE, BLOOD - Abnormal; Notable for the following:    Lipase 13 (*)    All other components within normal limits  PROTIME-INR    Imaging Review No results found.   EKG Interpretation None      MDM   Final diagnoses:  Vomiting in adult    Patient has not had any repeat vomiting while in the ER and she has no upper abdominal pain. Hemoglobin is stable. I have very low suspicion she has a true upper GI bleed and it may have been from a Mallory-Weiss tear. Given she has no recurrent symptoms while being watched in the ER I feel she is stable for discharge home. The rapid response team for OB was called and baby appears well and is in no distress. She has run out of her PPI, will give repeat prescription and recommend she continue this for at least one week in case there is a gastritis component.    Pricilla LovelessScott Winter Jocelyn, MD 11/15/14 1012

## 2014-11-14 NOTE — Progress Notes (Addendum)
1136  Arrived to evaluate this 19 yo G1P0 @ 31.[redacted] wks GA in with complaint of nausea/vomiting with heme and cough.  Patient denies contraction/ cramping, vaginal bleeding, or leaking of fluid from vagina.  Reports good fetal movement.  [facility] RN notes blood pressure to be elevated now and several times throughout prenatal record.  1211  Dr. Ellyn HackBovard notified of patient in ED, of complaints, EFM results of Category I tracing without UC's and of elevated BP's.  Advises that patient is OB cleared, but is to be given pre-eclampsia precautions.  Patient is to call and reschedule US appointment from 1400 today if she is not discharged from ED by then.

## 2014-11-14 NOTE — Discharge Instructions (Signed)
Nausea and Vomiting °Nausea is a sick feeling that often comes before throwing up (vomiting). Vomiting is a reflex where stomach contents come out of your mouth. Vomiting can cause severe loss of body fluids (dehydration). Children and elderly adults can become dehydrated quickly, especially if they also have diarrhea. Nausea and vomiting are symptoms of a condition or disease. It is important to find the cause of your symptoms. °CAUSES  °· Direct irritation of the stomach lining. This irritation can result from increased acid production (gastroesophageal reflux disease), infection, food poisoning, taking certain medicines (such as nonsteroidal anti-inflammatory drugs), alcohol use, or tobacco use. °· Signals from the brain. These signals could be caused by a headache, heat exposure, an inner ear disturbance, increased pressure in the brain from injury, infection, a tumor, or a concussion, pain, emotional stimulus, or metabolic problems. °· An obstruction in the gastrointestinal tract (bowel obstruction). °· Illnesses such as diabetes, hepatitis, gallbladder problems, appendicitis, kidney problems, cancer, sepsis, atypical symptoms of a heart attack, or eating disorders. °· Medical treatments such as chemotherapy and radiation. °· Receiving medicine that makes you sleep (general anesthetic) during surgery. °DIAGNOSIS °Your caregiver may ask for tests to be done if the problems do not improve after a few days. Tests may also be done if symptoms are severe or if the reason for the nausea and vomiting is not clear. Tests may include: °· Urine tests. °· Blood tests. °· Stool tests. °· Cultures (to look for evidence of infection). °· X-rays or other imaging studies. °Test results can help your caregiver make decisions about treatment or the need for additional tests. °TREATMENT °You need to stay well hydrated. Drink frequently but in small amounts. You may wish to drink water, sports drinks, clear broth, or eat frozen  ice pops or gelatin dessert to help stay hydrated. When you eat, eating slowly may help prevent nausea. There are also some antinausea medicines that may help prevent nausea. °HOME CARE INSTRUCTIONS  °· Take all medicine as directed by your caregiver. °· If you do not have an appetite, do not force yourself to eat. However, you must continue to drink fluids. °· If you have an appetite, eat a normal diet unless your caregiver tells you differently. °¨ Eat a variety of complex carbohydrates (rice, wheat, potatoes, bread), lean meats, yogurt, fruits, and vegetables. °¨ Avoid high-fat foods because they are more difficult to digest. °· Drink enough water and fluids to keep your urine clear or pale yellow. °· If you are dehydrated, ask your caregiver for specific rehydration instructions. Signs of dehydration may include: °¨ Severe thirst. °¨ Dry lips and mouth. °¨ Dizziness. °¨ Dark urine. °¨ Decreasing urine frequency and amount. °¨ Confusion. °¨ Rapid breathing or pulse. °SEEK IMMEDIATE MEDICAL CARE IF:  °· You have blood or brown flecks (like coffee grounds) in your vomit. °· You have black or bloody stools. °· You have a severe headache or stiff neck. °· You are confused. °· You have severe abdominal pain. °· You have chest pain or trouble breathing. °· You do not urinate at least once every 8 hours. °· You develop cold or clammy skin. °· You continue to vomit for longer than 24 to 48 hours. °· You have a fever. °MAKE SURE YOU:  °· Understand these instructions. °· Will watch your condition. °· Will get help right away if you are not doing well or get worse. °Document Released: 05/27/2005 Document Revised: 08/19/2011 Document Reviewed: 10/24/2010 °[employer]® Patient Information ©2015 [employer]. This information is not intended   to replace advice given to you by your health care provider. Make sure you discuss any questions you have with your health care provider. ° ° °Hematemesis °This condition is the vomiting of  blood. °CAUSES  °This can happen if you have a peptic ulcer or an irritation of the throat, stomach, or small bowel. Vomiting over and over again or swallowing blood from a nosebleed, coughing or facial injury can also result in bloody vomit. Anti-inflammatory pain medicines are a common cause of this potentially dangerous condition. The most serious causes of vomiting blood include: °· Ulcers (a bacteria called H. pylori is common cause of ulcers). °· Clotting problems. °· Alcoholism. °· Cirrhosis. °TREATMENT  °Treatment depends on the cause and the severity of the bleeding. Small amounts of blood streaks in the vomit is not the same as vomiting large amounts of bloody or dark, coffee grounds-like material. Weakness, fainting, dehydration, anemia, and continued alcohol or drug use increase the risk. Examination may include blood, vomit, or stool tests. The presence of bloody or dark stool that tests positive for blood (Hemoccult) means the bleeding has been going on for some time. Endoscopy and imaging studies may be done. Emergency treatment may include: °· IV medicines or fluids. °· Blood transfusions. °· Surgery. °Hospital care is required for high risk patients or when IV fluids or blood is needed. Upper GI bleeding can cause shock and death if not controlled. °HOME CARE INSTRUCTIONS  °· Your treatment does not require hospital care at this time. °· Remain at rest until your condition improves. °· Drink clear liquids as tolerated. °· Avoid: °¨ Alcohol. °¨ Nicotine. °¨ Aspirin. °¨ Any other anti-inflammatory medicine (ibuprofen, naproxen, and many others). °· Medications to suppress stomach acid or vomiting may be needed. Take all your medicine as prescribed. °· Be sure to see your caregiver for follow-up as recommended. °SEEK IMMEDIATE MEDICAL CARE IF:  °· You have repeated vomiting, dehydration, fainting, or extreme weakness. °· You are vomiting large amounts of bloody or dark material. °· You pass large, dark  or bloody stools. °Document Released: 07/04/2004 Document Revised: 08/19/2011 Document Reviewed: 07/20/2008 °ExitCare® Patient Information ©2015 [employer]. This information is not intended to replace advice given to you by your health care provider. Make sure you discuss any questions you have with your health care provider. ° °

## 2014-11-14 NOTE — ED Notes (Signed)
Pt [redacted] weeks pregnant c/o cold symptoms x 2 weeks, emesis, hematemesis onset this morning.

## 2014-12-03 ENCOUNTER — Encounter (HOSPITAL_COMMUNITY): Payer: Self-pay | Admitting: *Deleted

## 2014-12-03 ENCOUNTER — Inpatient Hospital Stay (HOSPITAL_COMMUNITY)
Admission: AD | Admit: 2014-12-03 | Discharge: 2014-12-03 | Disposition: A | Discharge status: Home / Self Care | Payer: Medicaid Other | Source: Ambulatory Visit | Attending: Obstetrics and Gynecology | Admitting: Obstetrics and Gynecology

## 2014-12-03 DIAGNOSIS — O479 False labor, unspecified: Secondary | ICD-10-CM

## 2014-12-03 DIAGNOSIS — O4703 False labor before 37 completed weeks of gestation, third trimester: Secondary | ICD-10-CM | POA: Diagnosis not present

## 2014-12-03 DIAGNOSIS — Z3A35 35 weeks gestation of pregnancy: Secondary | ICD-10-CM | POA: Insufficient documentation

## 2014-12-03 LAB — URINE MICROSCOPIC-ADD ON

## 2014-12-03 LAB — URINALYSIS, ROUTINE W REFLEX MICROSCOPIC
BILIRUBIN URINE: NEGATIVE
GLUCOSE, UA: NEGATIVE mg/dL
HGB URINE DIPSTICK: NEGATIVE
Ketones, ur: NEGATIVE mg/dL
NITRITE: NEGATIVE
PH: 6.5 (ref 5.0–8.0)
Protein, ur: NEGATIVE mg/dL
Specific Gravity, Urine: 1.02 (ref 1.005–1.030)
Urobilinogen, UA: 0.2 mg/dL (ref 0.0–1.0)

## 2014-12-03 NOTE — Discharge Instructions (Signed)
Braxton Hicks Contractions °Contractions of the uterus can occur throughout pregnancy. Contractions are not always a sign that you are in labor.  °WHAT ARE BRAXTON HICKS CONTRACTIONS?  °Contractions that occur before labor are called Braxton Hicks contractions, or false labor. Toward the end of pregnancy (32-34 weeks), these contractions can develop more often and may become more forceful. This is not true labor because these contractions do not result in opening (dilatation) and thinning of the cervix. They are sometimes difficult to tell apart from true labor because these contractions can be forceful and people have different pain tolerances. You should not feel embarrassed if you go to the hospital with false labor. Sometimes, the only way to tell if you are in true labor is for your health care provider to look for changes in the cervix. °If there are no prenatal problems or other health problems associated with the pregnancy, it is completely safe to be sent home with false labor and await the onset of true labor. °HOW CAN YOU TELL THE DIFFERENCE BETWEEN TRUE AND FALSE LABOR? °False Labor °· The contractions of false labor are usually shorter and not as hard as those of true labor.   °· The contractions are usually irregular.   °· The contractions are often felt in the front of the lower abdomen and in the groin.   °· The contractions may go away when you walk around or change positions while lying down.   °· The contractions get weaker and are shorter lasting as time goes on.   °· The contractions do not usually become progressively stronger, regular, and closer together as with true labor.   °True Labor °· Contractions in true labor last 30-70 seconds, become very regular, usually become more intense, and increase in frequency.   °· The contractions do not go away with walking.   °· The discomfort is usually felt in the top of the uterus and spreads to the lower abdomen and low back.   °· True labor can be  determined by your health care provider with an exam. This will show that the cervix is dilating and getting thinner.   °WHAT TO REMEMBER °· Keep up with your usual exercises and follow other instructions given by your health care provider.   °· Take medicines as directed by your health care provider.   °· Keep your regular prenatal appointments.   °· Eat and drink lightly if you think you are going into labor.   °· If Braxton Hicks contractions are making you uncomfortable:   °¨ Change your position from lying down or resting to walking, or from walking to resting.   °¨ Sit and rest in a tub of warm water.   °¨ Drink 2-3 glasses of water. Dehydration may cause these contractions.   °¨ Do slow and deep breathing several times an hour.   °WHEN SHOULD I SEEK IMMEDIATE MEDICAL CARE? °Seek immediate medical care if: °· Your contractions become stronger, more regular, and closer together.   °· You have fluid leaking or gushing from your vagina.   °· You have a fever.   °· You pass blood-tinged mucus.   °· You have vaginal bleeding.   °· You have continuous abdominal pain.   °· You have low back pain that you never had before.   °· You feel your baby's head pushing down and causing pelvic pressure.   °· Your baby is not moving as much as it used to.   °Document Released: 05/27/2005 Document Revised: 06/01/2013 Document Reviewed: 03/08/2013 °[employer]® Patient Information ©2015 [employer]. This information is not intended to replace advice given to you by your health care   provider. Make sure you discuss any questions you have with your health care provider. ° °

## 2014-12-03 NOTE — MAU Note (Addendum)
Pt stated she has been having ctx on and off for several weeks but they got worse today. Denies SROM or bleeding and reports good fetal movement. Felt 3 ctx today. Reports increased pelvic pressure.

## 2014-12-03 NOTE — MAU Provider Note (Signed)
History     CSN: [id number]  Arrival date and time: 12/03/14 1626   First Provider Initiated Contact with Patient 12/03/14 1700      Chief Complaint  Patient presents with  . Contractions   HPI 19 y.o. G1P0 with contractions and pelvic pressure. States she has had contractions about 1 time daily for weeks, today has felt 3 contractions. No bleeding or LOF, uncomplicated prenatal course.   Past Medical History  Diagnosis Date  . Anal or rectal pain   . Hemorrhoids   . Asthma   . Obesity   . GERD (gastroesophageal reflux disease)     Past Surgical History  Procedure Laterality Date  . No past surgeries      Family History  Problem Relation Age of Onset  . Hypertension Mother   . Hypertension Father   . Diabetes Father   . Hypertension Other   . Diabetes Other   . Cancer Other   . Heart failure Other     History  Substance Use Topics  . Smoking status: Never Smoker   . Smokeless tobacco: Never Used  . Alcohol Use: No    Allergies:  Allergies  Allergen Reactions  . Strawberry Hives    Prescriptions prior to admission  Medication Sig Dispense Refill Last Dose  . acetaminophen (TYLENOL) 325 MG tablet Take 650 mg by mouth every 6 (six) hours as needed for headache.   prn at prn  . albuterol (PROVENTIL HFA;VENTOLIN HFA) 108 (90 BASE) MCG/ACT inhaler Inhale into the lungs every 6 (six) hours as needed for wheezing or shortness of breath.   rescue at rescue  . carboxymethylcellulose (REFRESH PLUS) 0.5 % SOLN 1 drop 3 (three) times daily.   12/03/2014 at Unknown time  . omeprazole (PRILOSEC) 20 MG capsule Take 2 capsules (40 mg total) by mouth daily. 30 capsule 0 12/02/2014 at Unknown time  . Prenatal Vit-Fe Fumarate-FA (MULTIVITAMIN-PRENATAL) 27-0.8 MG TABS tablet Take 1 tablet by mouth daily.    Past Week at Unknown time  . promethazine (PHENERGAN) 25 MG tablet Take 1 tablet (25 mg total) by mouth every 6 (six) hours as needed for nausea or vomiting. 30 tablet 0 prn  at prn  . Olopatadine HCl 0.2 % SOLN Apply 1 drop to eye daily. (Patient not taking: Reported on 12/03/2014) 1 Bottle 0 Not Taking at Unknown time    Review of Systems  Constitutional: Negative.   Respiratory: Negative.   Cardiovascular: Negative.   Gastrointestinal: Negative for nausea, vomiting, abdominal pain, diarrhea and constipation.  Genitourinary: Negative for dysuria, urgency, frequency, hematuria and flank pain.       Negative for vaginal bleeding, vaginal discharge, + contractions  Musculoskeletal: Negative.   Neurological: Negative.   Psychiatric/Behavioral: Negative.    Physical Exam   Blood pressure 114/62, pulse 86, temperature 98 F (36.7 C), temperature source Oral, resp. rate 18, last menstrual period 04/02/2014.  Physical Exam  Nursing note and vitals reviewed. Constitutional: She is oriented to person, place, and time. She appears well-developed and well-nourished. No distress.  Cardiovascular: Normal rate.   Respiratory: Effort normal.  GI: Soft.  Genitourinary: No vaginal discharge found.  Cervix very posterior, difficult to reach, but appears to be closed  Musculoskeletal: Normal range of motion.  Neurological: She is alert and oriented to person, place, and time.  Skin: Skin is warm and dry.  Psychiatric: She has a normal mood and affect.   EFM: 135, mod variability, + accels, no decels TOCO: occasional UC  MAU Course  Procedures Results for orders placed or performed during the hospital encounter of 12/03/14 (from the past 24 hour(s))  Urinalysis, Routine w reflex microscopic (not at [facility])     Status: Abnormal   Collection Time: 12/03/14  4:35 PM  Result Value Ref Range   Color, Urine YELLOW YELLOW   APPearance CLEAR CLEAR   Specific Gravity, Urine 1.020 1.005 - 1.030   pH 6.5 5.0 - 8.0   Glucose, UA NEGATIVE NEGATIVE mg/dL   Hgb urine dipstick NEGATIVE NEGATIVE   Bilirubin Urine NEGATIVE NEGATIVE   Ketones, ur NEGATIVE NEGATIVE mg/dL   Protein,  ur NEGATIVE NEGATIVE mg/dL   Urobilinogen, UA 0.2 0.0 - 1.0 mg/dL   Nitrite NEGATIVE NEGATIVE   Leukocytes, UA MODERATE (A) NEGATIVE  Urine microscopic-add on     Status: Abnormal   Collection Time: 12/03/14  4:35 PM  Result Value Ref Range   Squamous Epithelial / LPF FEW (A) RARE   WBC, UA 7-10 <3 WBC/hpf   Bacteria, UA FEW (A) RARE   Urine-Other MUCOUS PRESENT       Assessment and Plan   1. Braxton Hicks contractions       Medication List    TAKE these medications        acetaminophen 325 MG tablet  Commonly known as:  TYLENOL  Take 650 mg by mouth every 6 (six) hours as needed for headache.     albuterol 108 (90 BASE) MCG/ACT inhaler  Commonly known as:  PROVENTIL HFA;VENTOLIN HFA  Inhale into the lungs every 6 (six) hours as needed for wheezing or shortness of breath.     carboxymethylcellulose 0.5 % Soln  Commonly known as:  REFRESH PLUS  1 drop 3 (three) times daily.     multivitamin-prenatal 27-0.8 MG Tabs tablet  Take 1 tablet by mouth daily.     Olopatadine HCl 0.2 % Soln  Apply 1 drop to eye daily.     omeprazole 20 MG capsule  Commonly known as:  PRILOSEC  Take 2 capsules (40 mg total) by mouth daily.     promethazine 25 MG tablet  Commonly known as:  PHENERGAN  Take 1 tablet (25 mg total) by mouth every 6 (six) hours as needed for nausea or vomiting.            Follow-up Information    Follow up with Sherian Rein, MD.   Specialty:  Obstetrics and Gynecology   Why:  as scheduled or sooner as needed   Contact information:   [address] [place] [address] [phone]         Mahir Prabhakar 12/03/2014, 5:37 PM

## 2014-12-23 ENCOUNTER — Encounter (HOSPITAL_COMMUNITY): Payer: Self-pay | Admitting: *Deleted

## 2014-12-23 ENCOUNTER — Inpatient Hospital Stay (HOSPITAL_COMMUNITY)
Admission: AD | Admit: 2014-12-23 | Discharge: 2014-12-23 | Disposition: A | Discharge status: Home / Self Care | Payer: Medicaid Other | Source: Ambulatory Visit | Attending: Obstetrics and Gynecology | Admitting: Obstetrics and Gynecology

## 2014-12-23 DIAGNOSIS — Z3493 Encounter for supervision of normal pregnancy, unspecified, third trimester: Secondary | ICD-10-CM | POA: Diagnosis present

## 2014-12-23 LAB — URINALYSIS, ROUTINE W REFLEX MICROSCOPIC
GLUCOSE, UA: NEGATIVE mg/dL
Hgb urine dipstick: NEGATIVE
Ketones, ur: 40 mg/dL — AB
Nitrite: NEGATIVE
Protein, ur: NEGATIVE mg/dL
Specific Gravity, Urine: >1.03 — ABNORMAL HIGH (ref 1.005–1.030)
Urobilinogen, UA: 0.2 mg/dL (ref 0.0–1.0)
pH: 6 (ref 5.0–8.0)

## 2014-12-23 LAB — URINE MICROSCOPIC-ADD ON

## 2014-12-23 NOTE — Discharge Instructions (Signed)

## 2014-12-23 NOTE — MAU Note (Signed)
Patient presents at [redacted] weeks gestation with c/o contractions 2-5 minutes apart X 11/2-2 hours. Fetus active. Denies bleeding or discharge.

## 2014-12-29 ENCOUNTER — Encounter (HOSPITAL_COMMUNITY): Payer: Self-pay | Admitting: *Deleted

## 2014-12-29 ENCOUNTER — Telehealth (HOSPITAL_COMMUNITY): Payer: Self-pay | Admitting: *Deleted

## 2014-12-29 NOTE — Telephone Encounter (Signed)
Preadmission screen  

## 2015-01-04 ENCOUNTER — Encounter (HOSPITAL_COMMUNITY): Payer: Self-pay

## 2015-01-04 DIAGNOSIS — IMO0002 Reserved for concepts with insufficient information to code with codable children: Secondary | ICD-10-CM

## 2015-01-04 HISTORY — DX: Reserved for concepts with insufficient information to code with codable children: IMO0002

## 2015-01-04 NOTE — H&P (Signed)
Rhonda Hall is a 19 y.o. female G1P0 at 3JAELLA WEINERTr IOL secondary to IUGR - been fllowed by [place] for AFI, dopplers.  +FM, no LOF, no VB, occ ctx; d/w pt r/b/a of IOL.  Pregnancy complicated by Size greater than dates, US shows IUGR.  Also GBBS +  Maternal Medical History:  Contractions: Frequency: irregular.    Fetal activity: Perceived fetal activity is normal.    Prenatal complications: IUGR.   Prenatal Complications - Diabetes: none.    OB History    Gravida Para Term Preterm AB TAB SAB Ectopic Multiple Living   1             G1 present, IUGR PCOS No pap, no STDs  Past Medical History  Diagnosis Date  . Anal or rectal pain   . Hemorrhoids   . Asthma   . Obesity   . GERD (gastroesophageal reflux disease)   . PCOS (polycystic ovarian syndrome)   . IUGR (intrauterine growth restriction) 01/04/2015   Past Surgical History  Procedure Laterality Date  . No past surgeries     Family History: family history includes Diabetes in her father; Hypertension in her father and mother. MI, ARF, sarcoidosis, lung cancer Social History:  reports that she has never smoked. She has never used smokeless tobacco. She reports that she does not drink alcohol or use illicit drugs.single, works at [employer], Designer, fashion/clothing [employer]   Prenatal Transfer Tool  Maternal Diabetes: No Genetic Screening: Normal Maternal Ultrasounds/Referrals: Normal Fetal Ultrasounds or other Referrals:  None Maternal Substance Abuse:  No Significant Maternal Medications:  None Significant Maternal Lab Results:  Lab values include: Group B Strep positive Other Comments:  None  Review of Systems  Constitutional: Negative.   HENT: Negative.   Eyes: Negative.   Respiratory: Negative.   Cardiovascular: Negative.   Gastrointestinal: Negative.   Genitourinary: Negative.   Musculoskeletal: Negative.   Skin: Negative.   Neurological: Negative.   Psychiatric/Behavioral: Negative.       Last menstrual period  04/02/2014. Maternal Exam:  Uterine Assessment: Contraction frequency is irregular.   Abdomen: Fundal height is S>D, morbid obesity.   Estimated fetal weight is 6#.   Fetal presentation: vertex  Introitus: Normal vulva. Normal vagina.  Cervix: Cervix evaluated by digital exam.     Physical Exam  Constitutional: She is oriented to person, place, and time. She appears well-developed and well-nourished.  HENT:  Head: Normocephalic and atraumatic.  Cardiovascular: Normal rate and regular rhythm.   Respiratory: Effort normal and breath sounds normal. No respiratory distress. She has no wheezes.  GI: Soft. Bowel sounds are normal. She exhibits no distension. There is no tenderness.  MORBID OBESITY  Musculoskeletal: Normal range of motion.  Neurological: She is alert and oriented to person, place, and time.  Skin: Skin is warm and dry.  Psychiatric: She has a normal mood and affect. Her behavior is normal.    Prenatal labs: ABO, Rh: O/Positive/-- (12/28 0000) Antibody: Negative (12/28 0000) Rubella: Immune (12/28 0000) RPR: Nonreactive (12/28 0000)  HBsAg: Negative (12/28 0000)  HIV: Non-reactive (12/28 0000)  GBS: Positive (12/28 0000)   Hgb 12.9/Plt 296K/GC neg/Chl neg/Hgb electro WNL/ CF neg/First tri and AFP WNL/glucola 120  [place] nl anat,limite heart, post plac, female F/u completes anat  [place] for S>D - shows <10% nl dopplers Dopplers, AFI and BW NST folllowed  Assessment/Plan: 19yo G1P0 at 39+ for IOL given IUGR Induce with AROM after PCN and pitocin PCN for gbbs +  Epidural and IV pain meds prn Plan for SVD   Bovard-Stuckert, Kion Huntsberry 01/04/2015, 9:50 PM

## 2015-01-05 ENCOUNTER — Inpatient Hospital Stay (HOSPITAL_COMMUNITY): Payer: Medicaid Other | Admitting: Anesthesiology

## 2015-01-05 ENCOUNTER — Encounter (HOSPITAL_COMMUNITY): Payer: Self-pay

## 2015-01-05 ENCOUNTER — Inpatient Hospital Stay (HOSPITAL_COMMUNITY)
Admission: RE | Admit: 2015-01-05 | Discharge: 2015-01-07 | DRG: 775 | Disposition: A | Discharge status: Home / Self Care | Payer: Medicaid Other | Source: Ambulatory Visit | Attending: Obstetrics and Gynecology | Admitting: Obstetrics and Gynecology

## 2015-01-05 VITALS — BP 120/69 | HR 97 | Temp 98.0°F | Resp 18 | Ht 72.0 in | Wt 378.0 lb

## 2015-01-05 DIAGNOSIS — Z8249 Family history of ischemic heart disease and other diseases of the circulatory system: Secondary | ICD-10-CM | POA: Diagnosis not present

## 2015-01-05 DIAGNOSIS — Z3A39 39 weeks gestation of pregnancy: Secondary | ICD-10-CM | POA: Diagnosis present

## 2015-01-05 DIAGNOSIS — Z6841 Body Mass Index (BMI) 40.0 and over, adult: Secondary | ICD-10-CM | POA: Diagnosis not present

## 2015-01-05 DIAGNOSIS — Z833 Family history of diabetes mellitus: Secondary | ICD-10-CM

## 2015-01-05 DIAGNOSIS — K219 Gastro-esophageal reflux disease without esophagitis: Secondary | ICD-10-CM | POA: Diagnosis present

## 2015-01-05 DIAGNOSIS — O365990 Maternal care for other known or suspected poor fetal growth, unspecified trimester, not applicable or unspecified: Secondary | ICD-10-CM | POA: Diagnosis present

## 2015-01-05 DIAGNOSIS — O365930 Maternal care for other known or suspected poor fetal growth, third trimester, not applicable or unspecified: Secondary | ICD-10-CM | POA: Diagnosis present

## 2015-01-05 DIAGNOSIS — O99214 Obesity complicating childbirth: Secondary | ICD-10-CM | POA: Diagnosis present

## 2015-01-05 DIAGNOSIS — O9962 Diseases of the digestive system complicating childbirth: Secondary | ICD-10-CM | POA: Diagnosis present

## 2015-01-05 DIAGNOSIS — O99824 Streptococcus B carrier state complicating childbirth: Secondary | ICD-10-CM | POA: Diagnosis present

## 2015-01-05 DIAGNOSIS — IMO0002 Reserved for concepts with insufficient information to code with codable children: Secondary | ICD-10-CM

## 2015-01-05 HISTORY — DX: Reserved for concepts with insufficient information to code with codable children: IMO0002

## 2015-01-05 LAB — CBC
HCT: 39.6 % (ref 36.0–46.0)
Hemoglobin: 12.9 g/dL (ref 12.0–15.0)
MCH: 28.7 pg (ref 26.0–34.0)
MCHC: 32.6 g/dL (ref 30.0–36.0)
MCV: 88 fL (ref 78.0–100.0)
PLATELETS: 191 10*3/uL (ref 150–400)
RBC: 4.5 MIL/uL (ref 3.87–5.11)
RDW: 14.8 % (ref 11.5–15.5)
WBC: 9 10*3/uL (ref 4.0–10.5)

## 2015-01-05 LAB — HIV ANTIBODY (ROUTINE TESTING W REFLEX): HIV Screen 4th Generation wRfx: NONREACTIVE

## 2015-01-05 LAB — TYPE AND SCREEN
ABO/RH(D): O POS
Antibody Screen: NEGATIVE

## 2015-01-05 LAB — RPR: RPR Ser Ql: NONREACTIVE

## 2015-01-05 LAB — ABO/RH: ABO/RH(D): O POS

## 2015-01-05 MED ORDER — OXYCODONE-ACETAMINOPHEN 5-325 MG PO TABS: 1.0000 | ORAL_TABLET | ORAL | Status: DC | PRN

## 2015-01-05 MED ORDER — PRENATAL MULTIVITAMIN CH
1.0000 | ORAL_TABLET | Freq: Every day | ORAL | Status: DC
  Administered 2015-01-06: 1 via ORAL
  Filled 2015-01-05: qty 1

## 2015-01-05 MED ORDER — ONDANSETRON HCL 4 MG/2ML IJ SOLN: 4.0000 mg | Freq: Four times a day (QID) | INTRAMUSCULAR | Status: DC | PRN

## 2015-01-05 MED ORDER — PHENYLEPHRINE 40 MCG/ML (10ML) SYRINGE FOR IV PUSH (FOR BLOOD PRESSURE SUPPORT)
80.0000 ug | PREFILLED_SYRINGE | INTRAVENOUS | Status: DC | PRN
  Filled 2015-01-05: qty 2
  Filled 2015-01-05: qty 20

## 2015-01-05 MED ORDER — TERBUTALINE SULFATE 1 MG/ML IJ SOLN
0.2500 mg | Freq: Once | INTRAMUSCULAR | Status: DC | PRN
  Filled 2015-01-05: qty 1

## 2015-01-05 MED ORDER — CITRIC ACID-SODIUM CITRATE 334-500 MG/5ML PO SOLN: 30.0000 mL | ORAL | Status: DC | PRN

## 2015-01-05 MED ORDER — OXYCODONE-ACETAMINOPHEN 5-325 MG PO TABS: 2.0000 | ORAL_TABLET | ORAL | Status: DC | PRN

## 2015-01-05 MED ORDER — DIBUCAINE 1 % RE OINT: 1.0000 "application " | TOPICAL_OINTMENT | RECTAL | Status: DC | PRN

## 2015-01-05 MED ORDER — ACETAMINOPHEN 325 MG PO TABS: 650.0000 mg | ORAL_TABLET | ORAL | Status: DC | PRN

## 2015-01-05 MED ORDER — FENTANYL 2.5 MCG/ML BUPIVACAINE 1/10 % EPIDURAL INFUSION (WH - ANES)
14.0000 mL/h | INTRAMUSCULAR | Status: DC | PRN
Start: 2015-01-05 — End: 2015-01-07
  Administered 2015-01-05 (×2): 14 mL/h via EPIDURAL
  Filled 2015-01-05: qty 125

## 2015-01-05 MED ORDER — PENICILLIN G POTASSIUM 5000000 UNITS IJ SOLR
5.0000 10*6.[IU] | Freq: Once | INTRAVENOUS | Status: AC
  Administered 2015-01-05: 5 10*6.[IU] via INTRAVENOUS
  Filled 2015-01-05: qty 5

## 2015-01-05 MED ORDER — OXYCODONE-ACETAMINOPHEN 5-325 MG PO TABS
1.0000 | ORAL_TABLET | ORAL | Status: DC | PRN
  Administered 2015-01-05 – 2015-01-06 (×3): 1 via ORAL
  Filled 2015-01-05 (×3): qty 1

## 2015-01-05 MED ORDER — DIPHENHYDRAMINE HCL 50 MG/ML IJ SOLN: 12.5000 mg | INTRAMUSCULAR | Status: DC | PRN

## 2015-01-05 MED ORDER — ALBUTEROL SULFATE (2.5 MG/3ML) 0.083% IN NEBU: 3.0000 mL | INHALATION_SOLUTION | RESPIRATORY_TRACT | Status: DC | PRN

## 2015-01-05 MED ORDER — BUTORPHANOL TARTRATE 1 MG/ML IJ SOLN: 1.0000 mg | INTRAMUSCULAR | Status: DC | PRN

## 2015-01-05 MED ORDER — BENZOCAINE-MENTHOL 20-0.5 % EX AERO
1.0000 "application " | INHALATION_SPRAY | CUTANEOUS | Status: DC | PRN
  Administered 2015-01-05: 1 via TOPICAL
  Filled 2015-01-05: qty 56

## 2015-01-05 MED ORDER — OXYTOCIN BOLUS FROM INFUSION: 500.0000 mL | INTRAVENOUS | Status: DC

## 2015-01-05 MED ORDER — LACTATED RINGERS IV SOLN: INTRAVENOUS | Status: DC

## 2015-01-05 MED ORDER — LIDOCAINE HCL (PF) 1 % IJ SOLN
30.0000 mL | INTRAMUSCULAR | Status: DC | PRN
  Filled 2015-01-05: qty 30

## 2015-01-05 MED ORDER — DIPHENHYDRAMINE HCL 25 MG PO CAPS: 25.0000 mg | ORAL_CAPSULE | Freq: Four times a day (QID) | ORAL | Status: DC | PRN

## 2015-01-05 MED ORDER — SIMETHICONE 80 MG PO CHEW: 80.0000 mg | CHEWABLE_TABLET | ORAL | Status: DC | PRN

## 2015-01-05 MED ORDER — PANTOPRAZOLE SODIUM 40 MG PO TBEC: 40.0000 mg | DELAYED_RELEASE_TABLET | Freq: Every day | ORAL | Status: DC

## 2015-01-05 MED ORDER — LACTATED RINGERS IV SOLN: 500.0000 mL | INTRAVENOUS | Status: DC | PRN

## 2015-01-05 MED ORDER — ONDANSETRON HCL 4 MG/2ML IJ SOLN: 4.0000 mg | INTRAMUSCULAR | Status: DC | PRN

## 2015-01-05 MED ORDER — LANOLIN HYDROUS EX OINT
TOPICAL_OINTMENT | CUTANEOUS | Status: DC | PRN
Start: 2015-01-05 — End: 2015-01-07

## 2015-01-05 MED ORDER — LACTATED RINGERS IV SOLN
INTRAVENOUS | Status: DC
  Administered 2015-01-05: 16:00:00 via INTRAUTERINE

## 2015-01-05 MED ORDER — WITCH HAZEL-GLYCERIN EX PADS: 1.0000 "application " | MEDICATED_PAD | CUTANEOUS | Status: DC | PRN

## 2015-01-05 MED ORDER — ZOLPIDEM TARTRATE 5 MG PO TABS: 5.0000 mg | ORAL_TABLET | Freq: Every evening | ORAL | Status: DC | PRN

## 2015-01-05 MED ORDER — PENICILLIN G POTASSIUM 5000000 UNITS IJ SOLR
2.5000 10*6.[IU] | INTRAVENOUS | Status: DC
  Administered 2015-01-05: 2.5 10*6.[IU] via INTRAVENOUS
  Filled 2015-01-05 (×5): qty 2.5

## 2015-01-05 MED ORDER — IBUPROFEN 600 MG PO TABS
600.0000 mg | ORAL_TABLET | Freq: Four times a day (QID) | ORAL | Status: DC
  Administered 2015-01-05 – 2015-01-07 (×6): 600 mg via ORAL
  Filled 2015-01-05 (×6): qty 1

## 2015-01-05 MED ORDER — ONDANSETRON HCL 4 MG PO TABS: 4.0000 mg | ORAL_TABLET | ORAL | Status: DC | PRN

## 2015-01-05 MED ORDER — LACTATED RINGERS IV SOLN
INTRAVENOUS | Status: DC
  Administered 2015-01-05 (×2): via INTRAVENOUS

## 2015-01-05 MED ORDER — EPHEDRINE 5 MG/ML INJ
10.0000 mg | INTRAVENOUS | Status: DC | PRN
  Filled 2015-01-05: qty 2

## 2015-01-05 MED ORDER — LIDOCAINE HCL (PF) 1 % IJ SOLN
INTRAMUSCULAR | Status: DC | PRN
  Administered 2015-01-05 (×2): 8 mL via EPIDURAL

## 2015-01-05 MED ORDER — OXYTOCIN 40 UNITS IN LACTATED RINGERS INFUSION - SIMPLE MED
1.0000 m[IU]/min | INTRAVENOUS | Status: DC
  Administered 2015-01-05: 2 m[IU]/min via INTRAVENOUS
  Filled 2015-01-05: qty 1000

## 2015-01-05 MED ORDER — OXYTOCIN 40 UNITS IN LACTATED RINGERS INFUSION - SIMPLE MED
62.5000 mL/h | INTRAVENOUS | Status: DC
  Administered 2015-01-05: 62.5 mL/h via INTRAVENOUS

## 2015-01-05 MED ORDER — SENNOSIDES-DOCUSATE SODIUM 8.6-50 MG PO TABS
2.0000 | ORAL_TABLET | ORAL | Status: DC
  Administered 2015-01-06 (×2): 2 via ORAL
  Filled 2015-01-05 (×2): qty 2

## 2015-01-05 NOTE — Anesthesia Procedure Notes (Signed)
Epidural Patient location during procedure: OB Start time: 01/05/2015 1:56 PM End time: 01/05/2015 2:00 PM  Staffing Anesthesiologist: Leilani Able Performed by: anesthesiologist   Preanesthetic Checklist Completed: patient identified, surgical consent, pre-op evaluation, timeout performed, IV checked, risks and benefits discussed and monitors and equipment checked  Epidural Patient position: sitting Prep: site prepped and draped and DuraPrep Patient monitoring: continuous pulse ox and blood pressure Approach: midline Location: L3-L4 Injection technique: LOR air  Needle:  Needle type: Tuohy  Needle gauge: 17 G Needle length: 9 cm and 9 Needle insertion depth: 9 cm Catheter type: closed end flexible Catheter size: 19 Gauge Catheter at skin depth: 15 cm Test dose: negative and Other  Assessment Sensory level: T9 Events: blood not aspirated, injection not painful, no injection resistance, negative IV test and no paresthesia  Additional Notes Reason for block:procedure for pain

## 2015-01-05 NOTE — Progress Notes (Signed)
Patient ID: Rhonda Hall, female   DOB: Sep 10, 1995, 19 y.o.   MRN: [id number]  Getting uncomfortable  AFVSS gen NAD, uncomfortable  FHTs 120's - 130's, good var, category 1, after ROM, hard to trace, FSE attempted to place - unable toco irr - diff to trace IUPC placed  IUPC place w/o diff/comp FSE attempted to be placed  SVE 3.4/80/-1-0  Continue IOL, epidural to be placed

## 2015-01-05 NOTE — Anesthesia Preprocedure Evaluation (Signed)
Anesthesia Evaluation  Patient identified by MRN, date of birth, ID band Patient awake    Reviewed: Allergy & Precautions, H&P , NPO status , Patient's Chart, lab work & pertinent test results  Airway Mallampati: III  TM Distance: >3 FB Neck ROM: full    Dental no notable dental hx.    Pulmonary    Pulmonary exam normal       Cardiovascular negative cardio ROS Normal cardiovascular exam    Neuro/Psych negative neurological ROS  negative psych ROS   GI/Hepatic Neg liver ROS, GERD-  Medicated and Controlled,  Endo/Other  Morbid obesity  Renal/GU negative Renal ROS     Musculoskeletal   Abdominal (+) + obese,   Peds  Hematology negative hematology ROS (+)   Anesthesia Other Findings   Reproductive/Obstetrics (+) Pregnancy                             Anesthesia Physical Anesthesia Plan  ASA: III  Anesthesia Plan: Epidural   Post-op Pain Management:    Induction:   Airway Management Planned:   Additional Equipment:   Intra-op Plan:   Post-operative Plan:   Informed Consent: I have reviewed the patients History and Physical, chart, labs and discussed the procedure including the risks, benefits and alternatives for the proposed anesthesia with the patient or authorized representative who has indicated his/her understanding and acceptance.     Plan Discussed with:   Anesthesia Plan Comments:         Anesthesia Quick Evaluation

## 2015-01-05 NOTE — Progress Notes (Signed)
Patient ID: Rhonda Hall, female   DOB: [birth year], 19 y.o.   MRN: [id number]  Pt with some decels, helped with amniinfusion, cut pit was tachysystole  AFVSS gen NAD  FHTs 120's, good var, early/variable decel toco Q 2-3 min  SVE 10/100/+2  Start pushing soon.   Expect SVD, baby IUGR

## 2015-01-06 LAB — CBC
HEMATOCRIT: 31.3 % — AB (ref 36.0–46.0)
Hemoglobin: 10 g/dL — ABNORMAL LOW (ref 12.0–15.0)
MCH: 28.2 pg (ref 26.0–34.0)
MCHC: 31.9 g/dL (ref 30.0–36.0)
MCV: 88.2 fL (ref 78.0–100.0)
PLATELETS: 175 10*3/uL (ref 150–400)
RBC: 3.55 MIL/uL — ABNORMAL LOW (ref 3.87–5.11)
RDW: 14.9 % (ref 11.5–15.5)
WBC: 10.1 10*3/uL (ref 4.0–10.5)

## 2015-01-06 NOTE — Progress Notes (Signed)
UR chart review completed.  

## 2015-01-06 NOTE — Anesthesia Postprocedure Evaluation (Signed)
  Anesthesia Post-op Note  Patient: Rhonda Hall  Procedure(s) Performed: * No procedures listed *  Patient Location: Mother/Baby  Anesthesia Type:Epidural  Level of Consciousness: awake, alert , oriented and patient cooperative  Airway and Oxygen Therapy: Patient Spontanous Breathing  Post-op Pain: none  Post-op Assessment: Post-op Vital signs reviewed, Patient's Cardiovascular Status Stable, Respiratory Function Stable, Patent Airway, No signs of Nausea or vomiting, Adequate PO intake, Pain level controlled, No headache, No backache, Spinal receding and Patient able to bend at knees              Post-op Vital Signs: Reviewed and stable  Last Vitals:  Filed Vitals:   01/06/15 0500  BP: 115/75  Pulse: 98  Temp: 36.7 C  Resp: 18    Complications: No apparent anesthesia complications

## 2015-01-06 NOTE — Lactation Note (Signed)
This note was copied from the chart of Rhonda Mykell Labreck. Lactation Consultation Note New mom states that BF not going so well. Having difficulty w/little 5 lb 14oz. Baby Rhonda. Has dx: PCOS, very wide space between breast. Mom has tubular breast, glandular breast tissue appears limited to near the nipple, approx. size of lemon. DEBP not currently available. Will get her one as soon as one becomes available. Gave hand pump to stimulate breast until one becomes available. Hand expression taught, none noted. Since baby is 5lbs. 14 oz. Discussed need to supplement.  Mom encouraged to feed baby 8-12 times/24 hours and with feeding cues. Mom encouraged to waken baby for feeds.  Educated about newborn behavior, I&O, cluster feeding, supply and demand. Mom encouraged to do skin-to-skin.Referred to Baby and Me Book in Breastfeeding section Pg. 22-23 for position options and Proper latch demonstration. [facility] brochure given w/resources, support groups and LC services.  Patient Name: Rhonda Hall RUEAV'W Date: 01/06/2015 Reason for consult: Initial assessment   Maternal Data Has patient been taught Hand Expression?: Yes Does the patient have breastfeeding experience prior to this delivery?: No  Feeding Feeding Type: Bottle Fed - Formula Nipple Type: Slow - flow Length of feed: 10 min  LATCH Score/Interventions       Type of Nipple: Everted at rest and after stimulation  Comfort (Breast/Nipple): Soft / non-tender     Intervention(s): Skin to skin;Position options;Support Pillows;Breastfeeding basics reviewed     Lactation Tools Discussed/Used Tools: Pump Breast pump type: Double-Electric Breast Pump Pump Review: Setup, frequency, and cleaning;Milk Storage Initiated by:: Peri Jefferson RN Date initiated:: 01/06/15   Consult Status Consult Status: Follow-up Date: 01/06/15 (in pm) Follow-up type: In-patient    Escarlet Saathoff, Diamond Nickel 01/06/2015, 5:04 AM

## 2015-01-06 NOTE — Progress Notes (Signed)
Patient ID: Rhonda Hall, female   DOB: 1995-08-06, 19 y.o.   MRN: [id number] #1 afebrile BP normal no complaints

## 2015-01-07 MED ORDER — IBUPROFEN 600 MG PO TABS: 600.0000 mg | ORAL_TABLET | Freq: Four times a day (QID) | ORAL | Status: AC | PRN

## 2015-01-07 MED ORDER — OXYCODONE-ACETAMINOPHEN 5-325 MG PO TABS: 1.0000 | ORAL_TABLET | Freq: Four times a day (QID) | ORAL | Status: AC | PRN

## 2015-01-07 NOTE — Discharge Summary (Signed)
NAMEJOSSALYN, Rhonda Hall              ACCOUNT NO.:  [id number]  MEDICAL RECORD NO.:  [id number]  LOCATION:  9112                          FACILITY:  [facility]  PHYSICIAN:  Malachi Pro. Ambrose Mantle, M.D. DATE OF BIRTH:  [birth year]  DATE OF ADMISSION:  01/05/2015 DATE OF DISCHARGE:  01/07/2015                              DISCHARGE SUMMARY   HOSPITAL COURSE:  This is a 19 year old black female, at 39+ weeks gestation, for induction of labor, secondary to intrauterine growth restriction.  She had been followed by ultrasound and AFIs and Dopplers. Pregnancy complicated by intrauterine growth restriction and also group B strep positive.  After admission to the hospital, the patient was placed on Pitocin, at 1:14 p.m.  The cervix was 3-4 cm, 80%, vertex at - 1.  She received an epidural.  She progressed to 10 cm, at 5:03 p.m.  At 5:22 p.m., a living healthy female was delivered ROA, Apgars 9 and 9 at 1 and 5 minutes.  Left labial laceration repaired with 3-0 Vicryl. Repeat blood loss was 352 mL.  Postpartum, the patient did very well and on the second postpartum day was ready for discharge.  LABORATORY DATA:  Initial hemoglobin was 12.9, hematocrit 39.6, and white count 9000.  Followup hemoglobin 10.0.  FINAL DIAGNOSES:  Intrauterine pregnancy at 39+ weeks, delivered ROA.  OPERATION:  Spontaneous delivery, ROA and repair of left labial laceration.  FINAL CONDITION:  Improved.  INSTRUCTIONS:  Include our regular discharge instructions. Prescriptions for Percocet 5/325, 30 tablets, 1 every 6 hours as needed for pain and Motrin 600 mg 30 tablets, 1 every 6 hours as needed for pain.  She is to return to the office in 6 weeks for followup examination.  She is to call the office and set up an appointment for 6 weeks and at that time, I informed the receptionist of the method of birth control and her benefits can be checked.     Malachi Pro. Ambrose Mantle, M.D.     TFH/MEDQ  D:  01/07/2015  T:  01/07/2015   Job:  [id number]

## 2015-01-07 NOTE — Discharge Instructions (Signed)
booklet °

## 2015-01-07 NOTE — Progress Notes (Signed)
Patient ID: Rhonda Hall, female   DOB: May 17, 1996, 19 y.o.   MRN: [id number] #2 afebrile BP normal for d/c

## 2015-10-14 ENCOUNTER — Emergency Department (HOSPITAL_COMMUNITY)
Admission: EM | Admit: 2015-10-14 | Discharge: 2015-10-14 | Disposition: A | Discharge status: Home / Self Care | Payer: Medicaid Other | Attending: Emergency Medicine | Admitting: Emergency Medicine

## 2015-10-14 ENCOUNTER — Encounter (HOSPITAL_COMMUNITY): Payer: Self-pay | Admitting: Emergency Medicine

## 2015-10-14 ENCOUNTER — Emergency Department (HOSPITAL_COMMUNITY): Payer: Medicaid Other

## 2015-10-14 DIAGNOSIS — J45909 Unspecified asthma, uncomplicated: Secondary | ICD-10-CM | POA: Diagnosis not present

## 2015-10-14 DIAGNOSIS — Z7951 Long term (current) use of inhaled steroids: Secondary | ICD-10-CM | POA: Insufficient documentation

## 2015-10-14 DIAGNOSIS — Z79891 Long term (current) use of opiate analgesic: Secondary | ICD-10-CM | POA: Insufficient documentation

## 2015-10-14 DIAGNOSIS — E669 Obesity, unspecified: Secondary | ICD-10-CM | POA: Insufficient documentation

## 2015-10-14 DIAGNOSIS — J069 Acute upper respiratory infection, unspecified: Secondary | ICD-10-CM | POA: Diagnosis not present

## 2015-10-14 DIAGNOSIS — Z79899 Other long term (current) drug therapy: Secondary | ICD-10-CM | POA: Insufficient documentation

## 2015-10-14 DIAGNOSIS — K219 Gastro-esophageal reflux disease without esophagitis: Secondary | ICD-10-CM | POA: Diagnosis not present

## 2015-10-14 DIAGNOSIS — R05 Cough: Secondary | ICD-10-CM | POA: Diagnosis present

## 2015-10-14 DIAGNOSIS — Z791 Long term (current) use of non-steroidal anti-inflammatories (NSAID): Secondary | ICD-10-CM | POA: Insufficient documentation

## 2015-10-14 MED ORDER — BENZONATATE 100 MG PO CAPS: 100.0000 mg | ORAL_CAPSULE | Freq: Three times a day (TID) | ORAL | Status: DC

## 2015-10-14 NOTE — ED Notes (Signed)
Per pt, states cough and asthma symptoms since Thursday

## 2015-10-14 NOTE — ED Provider Notes (Signed)
CSN: [id number]     Arrival date & time 10/14/15  1446 History  By signing my name below, I, Marisue HumbleMichelle Chaffee, attest that this documentation has been prepared under the direction and in the presence of non-physician practitioner, Audry Piliyler Arno Cullers, PA. Electronically Signed: Marisue HumbleMichelle Chaffee, Scribe. 10/14/2015. 3:50 PM.   Chief Complaint  Patient presents with  . Cough   The history is provided by the patient. No language interpreter was used.   HPI Comments:  Lovenia KimMiracle N Wingert is a 20 y.o. female with PMHx of asthma who presents to the Emergency Department complaining of persistent cough intermittently productive of clear mucus onset 3 days ago, worsening upon waking this morning. Pt reports associated chest tightness, sore throat secondary to cough, rhinorrhea at night, and shortness of breath when walking. Pt has taken Allegra without relief; no alleviating factors noted. Pt notes sick contact with the flu. Denies sinus pressure, ear pain, nausea, vomiting, diarrhea, current pain or fever.  Past Medical History  Diagnosis Date  . Anal or rectal pain   . Hemorrhoids   . Asthma   . Obesity   . GERD (gastroesophageal reflux disease)   . PCOS (polycystic ovarian syndrome)   . IUGR (intrauterine growth restriction) 01/04/2015  . SVD (spontaneous vaginal delivery) 01/05/2015   Past Surgical History  Procedure Laterality Date  . No past surgeries     Family History  Problem Relation Age of Onset  . Hypertension Mother   . Hypertension Father   . Diabetes Father    Social History  Substance Use Topics  . Smoking status: Never Smoker   . Smokeless tobacco: Never Used  . Alcohol Use: No   OB History    Gravida Para Term Preterm AB TAB SAB Ectopic Multiple Living   1 1 1       0 1     Review of Systems  Constitutional: Negative for fever.  HENT: Positive for rhinorrhea and sore throat. Negative for sinus pressure.   Respiratory: Positive for cough, chest tightness and shortness of breath.    Gastrointestinal: Negative for nausea, vomiting and diarrhea.  Musculoskeletal: Negative for myalgias.   Allergies  Strawberry extract  Home Medications   Prior to Admission medications   Medication Sig Start Date End Date Taking? Authorizing Provider  albuterol (PROVENTIL HFA;VENTOLIN HFA) 108 (90 BASE) MCG/ACT inhaler Inhale into the lungs every 6 (six) hours as needed for wheezing or shortness of breath.    Historical Provider, MD  ibuprofen (ADVIL,MOTRIN) 600 MG tablet Take 1 tablet (600 mg total) by mouth every 6 (six) hours as needed. 01/07/15   Tracey Harrieshomas Henley, MD  omeprazole (PRILOSEC) 20 MG capsule Take 2 capsules (40 mg total) by mouth daily. 11/14/14   Pricilla LovelessScott Goldston, MD  oxyCODONE-acetaminophen (PERCOCET/ROXICET) 5-325 MG per tablet Take 1 tablet by mouth every 6 (six) hours as needed (for pain scale 4-7). 01/07/15   Tracey Harrieshomas Henley, MD  Prenatal Vit-Fe Fumarate-FA (MULTIVITAMIN-PRENATAL) 27-0.8 MG TABS tablet Take 1 tablet by mouth daily.     Historical Provider, MD   BP 137/88 mmHg  Pulse 83  Temp(Src) 98.8 F (37.1 C) (Oral)  Resp 18  SpO2 99%  LMP 09/15/2015   Physical Exam  Constitutional: She is oriented to person, place, and time. She appears well-developed and well-nourished. No distress.  HENT:  Head: Normocephalic and atraumatic.  Right Ear: Tympanic membrane, external ear and ear canal normal.  Left Ear: Tympanic membrane, external ear and ear canal normal.  Nose: Nose normal.  Mouth/Throat: Uvula is midline, oropharynx is clear and moist and mucous membranes are normal. No trismus in the jaw. No oropharyngeal exudate, posterior oropharyngeal erythema or tonsillar abscesses.  Mild oropharyngeal erythema   Eyes: Conjunctivae and EOM are normal. Pupils are equal, round, and reactive to light. Right eye exhibits no discharge. Left eye exhibits no discharge.  Neck: Normal range of motion. Neck supple. No tracheal deviation present.  Cardiovascular: Normal rate,  regular rhythm, S1 normal, S2 normal, normal heart sounds, intact distal pulses and normal pulses.   Pulmonary/Chest: Effort normal and breath sounds normal. No respiratory distress. She has no decreased breath sounds. She has no wheezes. She has no rhonchi. She has no rales.  Abdominal: Normal appearance and bowel sounds are normal. There is no tenderness.  Musculoskeletal: Normal range of motion.  Lymphadenopathy:    She has no cervical adenopathy.  Neurological: She is alert and oriented to person, place, and time. Coordination normal.  Skin: Skin is warm and dry. No rash noted. She is not diaphoretic. No erythema.  Psychiatric: She has a normal mood and affect. Her speech is normal and behavior is normal. Thought content normal.  Nursing note and vitals reviewed.  ED Course  Procedures  DIAGNOSTIC STUDIES:  Oxygen Saturation is 99% on RA, normal by my interpretation.    COORDINATION OF CARE:  3:48 PM Will order chest x-ray. Recommended Micinex D. Discussed treatment plan with pt at bedside and pt agreed to plan.  Labs Review  Labs Reviewed - No data to display  Imaging Review Dg Chest 2 View  10/14/2015  CLINICAL DATA:  Cough for 3 days EXAM: CHEST  2 VIEW COMPARISON:  07/02/2007 FINDINGS: Normal heart size, mediastinal contours, and pulmonary vascularity. Lungs clear. No pleural effusion or pneumothorax. Bones unremarkable. IMPRESSION: Normal exam. Electronically Signed   By: Ulyses Southward M.D.   On: 10/14/2015 16:37   I have personally reviewed and evaluated these images and lab results as part of my medical decision-making.   EKG Interpretation None      MDM  I have reviewed and evaluated the relevant imaging studies.  I have reviewed the relevant previous healthcare records. I obtained HPI from historian.  ED Course:  Assessment: Pt is a 19yF presents with URI symptoms since Thursday . On exam, pt in NAD. VSS. Afebrile. Lungs CTA, Heart RRR. Abdomen nontender/soft. Pt CXR  negative for acute infiltrate. Patients symptoms are consistent with URI, likely viral etiology. Discussed that antibiotics are not indicated for viral infections. Pt will be discharged with symptomatic treatment.  Verbalizes understanding and is agreeable with plan. Pt is hemodynamically stable & in NAD prior to dc.  Disposition/Plan:  DC Home Additional Verbal discharge instructions given and discussed with patient.  Pt Instructed to f/u with PCP in the next week for evaluation and treatment of symptoms. Return precautions given Pt acknowledges and agrees with plan  Supervising Physician Lorre Nick, MD   Final diagnoses:  Viral URI   I personally performed the services described in this documentation, which was scribed in my presence. The recorded information has been reviewed and is accurate.   Audry Pili, PA-C 10/14/15 1648  Lorre Nick, MD 10/14/15 2252

## 2015-10-14 NOTE — Discharge Instructions (Signed)
Please read and follow all provided instructions.  Your diagnoses today include:  1. Viral URI    You appear to have an upper respiratory infection (URI). An upper respiratory tract infection, or cold, is a viral infection of the air passages leading to the lungs. It should improve gradually after 5-7 days. You may have a lingering cough that lasts for 2- 4 weeks after the infection.  Tests performed today include:  Vital signs. See below for your results today.   Medications prescribed:   Take any prescribed medications only as directed. Treatment for your infection is aimed at treating the symptoms. There are no medications, such as antibiotics, that will cure your infection.   Home care instructions:  Follow any educational materials contained in this packet.   Your illness is contagious and can be spread to others, especially during the first 3 or 4 days. It cannot be cured by antibiotics or other medicines. Take basic precautions such as washing your hands often, covering your mouth when you cough or sneeze, and avoiding public places where you could spread your illness to others.   Please continue drinking plenty of fluids.  Use over-the-counter medicines as needed as directed on packaging for symptom relief.  You may also use ibuprofen or tylenol as directed on packaging for pain or fever.  Do not take multiple medicines containing Tylenol or acetaminophen to avoid taking too much of this medication.  Follow-up instructions: Please follow-up with your primary care provider in the next 3 days for further evaluation of your symptoms if you are not feeling better.   Return instructions:   Please return to the Emergency Department if you experience worsening symptoms.   RETURN IMMEDIATELY IF you develop shortness of breath, confusion or altered mental status, a new rash, become dizzy, faint, or poorly responsive, or are unable to be cared for at home.  Please return if you have  persistent vomiting and cannot keep down fluids or develop a fever that is not controlled by tylenol or motrin.    Please return if you have any other emergent concerns.  Additional Information:  Your vital signs today were: BP 137/88 mmHg   Pulse 83   Temp(Src) 98.8 F (37.1 C) (Oral)   Resp 18   SpO2 99%   LMP 09/15/2015 If your blood pressure (BP) was elevated above 135/85 this visit, please have this repeated by your doctor within one month. --------------

## 2016-03-01 ENCOUNTER — Encounter (HOSPITAL_COMMUNITY): Payer: Self-pay | Admitting: Emergency Medicine

## 2016-03-01 ENCOUNTER — Emergency Department (HOSPITAL_COMMUNITY)
Admission: EM | Admit: 2016-03-01 | Discharge: 2016-03-01 | Disposition: A | Discharge status: Home / Self Care | Payer: Medicaid Other | Attending: Emergency Medicine | Admitting: Emergency Medicine

## 2016-03-01 DIAGNOSIS — M545 Low back pain, unspecified: Secondary | ICD-10-CM

## 2016-03-01 DIAGNOSIS — Z7951 Long term (current) use of inhaled steroids: Secondary | ICD-10-CM | POA: Insufficient documentation

## 2016-03-01 DIAGNOSIS — J45909 Unspecified asthma, uncomplicated: Secondary | ICD-10-CM | POA: Diagnosis not present

## 2016-03-01 DIAGNOSIS — Z79899 Other long term (current) drug therapy: Secondary | ICD-10-CM | POA: Insufficient documentation

## 2016-03-01 LAB — CBC
HCT: 41.3 % (ref 36.0–46.0)
Hemoglobin: 13 g/dL (ref 12.0–15.0)
MCH: 28.4 pg (ref 26.0–34.0)
MCHC: 31.5 g/dL (ref 30.0–36.0)
MCV: 90.2 fL (ref 78.0–100.0)
PLATELETS: 283 10*3/uL (ref 150–400)
RBC: 4.58 MIL/uL (ref 3.87–5.11)
RDW: 14.9 % (ref 11.5–15.5)
WBC: 7 10*3/uL (ref 4.0–10.5)

## 2016-03-01 LAB — COMPREHENSIVE METABOLIC PANEL
ALK PHOS: 67 U/L (ref 38–126)
ALT: 27 U/L (ref 14–54)
AST: 19 U/L (ref 15–41)
Albumin: 3.7 g/dL (ref 3.5–5.0)
Anion gap: 6 (ref 5–15)
BILIRUBIN TOTAL: 0.6 mg/dL (ref 0.3–1.2)
BUN: 12 mg/dL (ref 6–20)
CO2: 28 mmol/L (ref 22–32)
CREATININE: 0.78 mg/dL (ref 0.44–1.00)
Calcium: 8.9 mg/dL (ref 8.9–10.3)
Chloride: 105 mmol/L (ref 101–111)
GFR calc non Af Amer: >60 mL/min (ref >60)
Glucose, Bld: 97 mg/dL (ref 65–99)
Potassium: 4 mmol/L (ref 3.5–5.1)
Sodium: 139 mmol/L (ref 135–145)
TOTAL PROTEIN: 7.3 g/dL (ref 6.5–8.1)

## 2016-03-01 LAB — URINALYSIS, ROUTINE W REFLEX MICROSCOPIC
BILIRUBIN URINE: NEGATIVE
Glucose, UA: NEGATIVE mg/dL
HGB URINE DIPSTICK: NEGATIVE
Ketones, ur: NEGATIVE mg/dL
NITRITE: NEGATIVE
Protein, ur: NEGATIVE mg/dL
Specific Gravity, Urine: 1.016 (ref 1.005–1.030)
pH: 6.5 (ref 5.0–8.0)

## 2016-03-01 LAB — URINE MICROSCOPIC-ADD ON

## 2016-03-01 LAB — LIPASE, BLOOD: Lipase: 24 U/L (ref 11–51)

## 2016-03-01 LAB — POC URINE PREG, ED: PREG TEST UR: NEGATIVE

## 2016-03-01 MED ORDER — CYCLOBENZAPRINE HCL 10 MG PO TABS
5.0000 mg | ORAL_TABLET | Freq: Once | ORAL | Status: AC
  Administered 2016-03-01: 5 mg via ORAL
  Filled 2016-03-01: qty 1

## 2016-03-01 MED ORDER — HYDROCODONE-ACETAMINOPHEN 5-325 MG PO TABS
1.0000 | ORAL_TABLET | Freq: Once | ORAL | Status: AC
  Administered 2016-03-01: 1 via ORAL
  Filled 2016-03-01: qty 1

## 2016-03-01 MED ORDER — CYCLOBENZAPRINE HCL 10 MG PO TABS: 10.0000 mg | ORAL_TABLET | Freq: Two times a day (BID) | ORAL | 0 refills | Status: AC | PRN

## 2016-03-01 MED ORDER — HYDROCODONE-ACETAMINOPHEN 5-325 MG PO TABS: 1.0000 | ORAL_TABLET | Freq: Four times a day (QID) | ORAL | 0 refills | Status: AC | PRN

## 2016-03-01 NOTE — ED Triage Notes (Signed)
Pt from home with complaints of lower middle abdominal pain and back spasms. Pt denies urinary symptoms, but does endorse nausea without emesis. Pt denies diarrhea. Pt states symptoms have been present for about 1 week.

## 2016-03-01 NOTE — ED Notes (Signed)
Bed: WA11 Expected date:  Expected time:  Means of arrival:  Comments: triage 

## 2016-03-01 NOTE — ED Provider Notes (Signed)
[facility]-EMERGENCY DEPT Provider Note   CSN: [id number] Arrival date & time: 03/01/16  1522    History   Chief Complaint Chief Complaint  Patient presents with  . Abdominal Pain  . Back Pain    HPI Rhonda Hall is a 20 y.o. female with past medical history significant for PCOS, GERD, and asthma who presents with back pain. Patient reports that last several days, she has had pain on her bilateral back that radiates around towards the abdomen. She reports that her pain is worse when she picks up her child or bends over. She reports mild nausea but no vomiting, no fevers, chills, chest pain, shortness of breath, constipation, diarrhea, or dysuria. Patient reports normal bowel movement today. Patient says that the pain is a 6 out of 10 severity but can be as bad as an 8 out of 10. Patient says it is a aching and burning. Patient said it feels similar to when she pulled her back muscles in high school. Patient reports that she does lift some heavy objects. Patient denies loss of bowel or bladder control. She also denies any focal neurologic deficits in her lower extremities.  The history is provided by the patient and medical records. No language interpreter was used.  Back Pain   This is a recurrent problem. The current episode started more than 2 days ago. The problem occurs constantly. The problem has not changed since onset.The pain is associated with lifting heavy objects. The pain is present in the lumbar spine. The quality of the pain is described as burning and cramping. Radiates to: around to abdomen. The pain is at a severity of 6/10. The pain is moderate. The symptoms are aggravated by twisting. Associated symptoms include abdominal pain. Pertinent negatives include no chest pain, no fever, no numbness, no headaches, no abdominal swelling, no bowel incontinence, no perianal numbness, no bladder incontinence, no dysuria, no pelvic pain, no leg pain, no tingling and no weakness. She has tried  nothing for the symptoms. The treatment provided no relief. Risk factors include obesity.    Past Medical History:  Diagnosis Date  . Anal or rectal pain   . Asthma   . GERD (gastroesophageal reflux disease)   . Hemorrhoids   . IUGR (intrauterine growth restriction) 01/04/2015  . Obesity   . PCOS (polycystic ovarian syndrome)   . SVD (spontaneous vaginal delivery) 01/05/2015    Patient Active Problem List   Diagnosis Date Noted  . IUGR (intrauterine growth restriction) affecting care of mother 01/05/2015  . SVD (spontaneous vaginal delivery) 01/05/2015  . IUGR (intrauterine growth restriction) 01/04/2015  . Anal or rectal pain   . Hemorrhoids     Past Surgical History:  Procedure Laterality Date  . NO PAST SURGERIES      OB History    Gravida Para Term Preterm AB Living   1 1 1     1    SAB TAB Ectopic Multiple Live Births         0 1       Home Medications    Prior to Admission medications   Medication Sig Start Date End Date Taking? Authorizing Provider  albuterol (PROVENTIL HFA;VENTOLIN HFA) 108 (90 BASE) MCG/ACT inhaler Inhale into the lungs every 6 (six) hours as needed for wheezing or shortness of breath.    Historical Provider, MD  benzonatate (TESSALON) 100 MG capsule Take 1 capsule (100 mg total) by mouth every 8 (eight) hours. 10/14/15   Audry Piliyler Mohr, PA-C  ibuprofen (  ADVIL,MOTRIN) 600 MG tablet Take 1 tablet (600 mg total) by mouth every 6 (six) hours as needed. 01/07/15   Tracey Harries, MD  omeprazole (PRILOSEC) 20 MG capsule Take 2 capsules (40 mg total) by mouth daily. 11/14/14   Pricilla Loveless, MD  oxyCODONE-acetaminophen (PERCOCET/ROXICET) 5-325 MG per tablet Take 1 tablet by mouth every 6 (six) hours as needed (for pain scale 4-7). 01/07/15   Tracey Harries, MD  Prenatal Vit-Fe Fumarate-FA (MULTIVITAMIN-PRENATAL) 27-0.8 MG TABS tablet Take 1 tablet by mouth daily.     Historical Provider, MD    Family History Family History  Problem Relation Age of Onset  .  Hypertension Mother   . Hypertension Father   . Diabetes Father     Social History Social History  Substance Use Topics  . Smoking status: Never Smoker  . Smokeless tobacco: Never Used  . Alcohol use No     Allergies   Strawberry extract   Review of Systems Review of Systems  Constitutional: Negative for chills, diaphoresis and fever.  HENT: Negative for rhinorrhea.   Respiratory: Negative for cough, chest tightness, shortness of breath, wheezing and stridor.   Cardiovascular: Negative for chest pain and palpitations.  Gastrointestinal: Positive for abdominal pain. Negative for bowel incontinence, constipation, diarrhea, nausea and vomiting.  Genitourinary: Negative for bladder incontinence, dysuria, flank pain, hematuria, pelvic pain, vaginal bleeding, vaginal discharge and vaginal pain.  Musculoskeletal: Positive for back pain. Negative for gait problem and neck pain.  Skin: Negative for rash and wound.  Neurological: Negative for dizziness, tingling, seizures, weakness, light-headedness, numbness and headaches.  Psychiatric/Behavioral: Negative for agitation.  All other systems reviewed and are negative.    Physical Exam Updated Vital Signs BP 126/87   Pulse 87   Temp 98.7 F (37.1 C) (Oral)   Resp 18   Ht 6' (1.829 m)   Wt (!) 378 lb (171.5 kg)   SpO2 100%   BMI 51.27 kg/m   Physical Exam  Constitutional: She is oriented to person, place, and time. She appears well-developed and well-nourished. No distress.  HENT:  Head: Normocephalic and atraumatic.  Mouth/Throat: Oropharynx is clear and moist. No oropharyngeal exudate.  Eyes: Conjunctivae and EOM are normal. Pupils are equal, round, and reactive to light.  Neck: Normal range of motion. Neck supple.  Cardiovascular: Normal rate and regular rhythm.   No murmur heard. Pulmonary/Chest: Effort normal and breath sounds normal. No stridor. No respiratory distress. She exhibits no tenderness.  Abdominal: Soft.  Normal appearance and bowel sounds are normal. She exhibits no distension. There is no tenderness. There is no rigidity and no CVA tenderness.  Musculoskeletal: She exhibits no edema.       Lumbar back: She exhibits tenderness and pain. She exhibits no deformity and no laceration.       Back:  Neurological: She is alert and oriented to person, place, and time. She has normal reflexes. She displays normal reflexes. No cranial nerve deficit. She exhibits normal muscle tone. Coordination normal.  Skin: Skin is warm and dry. Capillary refill takes less than 2 seconds. No rash noted.  Psychiatric: She has a normal mood and affect.  Nursing note and vitals reviewed.    ED Treatments / Results  Labs (all labs ordered are listed, but only abnormal results are displayed) Labs Reviewed  URINALYSIS, ROUTINE W REFLEX MICROSCOPIC (NOT AT [facility]) - Abnormal; Notable for the following:       Result Value   APPearance CLOUDY (*)    Leukocytes, UA  SMALL (*)    All other components within normal limits  URINE MICROSCOPIC-ADD ON - Abnormal; Notable for the following:    Squamous Epithelial / LPF 0-5 (*)    Bacteria, UA FEW (*)    All other components within normal limits  LIPASE, BLOOD  COMPREHENSIVE METABOLIC PANEL  CBC  POC URINE PREG, ED    EKG  EKG Interpretation None       Radiology No results found.  Procedures Procedures (including critical care time)  Medications Ordered in ED Medications  HYDROcodone-acetaminophen (NORCO/VICODIN) 5-325 MG per tablet 1 tablet (1 tablet Oral Given 03/01/16 1845)  cyclobenzaprine (FLEXERIL) tablet 5 mg (5 mg Oral Given 03/01/16 1845)     Initial Impression / Assessment and Plan / ED Course  I have reviewed the triage vital signs and the nursing notes.  Pertinent labs & imaging results that were available during my care of the patient were reviewed by me and considered in my medical decision making (see chart for details).  Clinical Course     Megyn LOUVINIA CUMBO is a 20 y.o. female with past medical history significant for PCOS, GERD, and asthma who presents with back pain. History and exam are above. Given patients abdominal pain, laboratory testing was ordered. No GU complaints and pain was not in the lower abdomen. No significant abdominal tenderness appreciated. Doubt gynecologic etiology of back pain.    Laboratory testing return showing no pregnancy, No definitive evidence of UTI, Normal lipase, normal CBC, and a remarkable CMP. Your answer no evidence of kidney stone as cause pain.  Given patients paraspinal Muscle tenderness and Reassuring labs, feel pain is likely musculoskeletal. Patient Given pain medicine and muscle relaxants with significant reduction in pain. Patient does think she may have pulled her back while lifting heavy objects and has not been able to rest her back as she picks up her young daughter frequently.  Patient given instructions on symptomatic management of back pain meds and was provided prescriptions for pain and muscle relaxants. Patient instructed to follow up with PCP for ongoing back pain management. Patient understood return precautions were new or worsening symptoms and patient was discharged in good condition.    Final Clinical Impressions(s) / ED Diagnoses   Final diagnoses:  Bilateral low back pain without sciatica    New Prescriptions Discharge Medication List as of 03/01/2016  8:09 PM    START taking these medications   Details  cyclobenzaprine (FLEXERIL) 10 MG tablet Take 1 tablet (10 mg total) by mouth 2 (two) times daily as needed for muscle spasms., Starting Fri 03/01/2016, Print    HYDROcodone-acetaminophen (NORCO/VICODIN) 5-325 MG tablet Take 1 tablet by mouth every 6 (six) hours as needed., Starting Fri 03/01/2016, Print         Clinical Impression: 1. Bilateral low back pain without sciatica     Disposition: Discharge  Condition: Good  I have discussed the results,  Dx and Tx plan with the pt(& family if present). He/she/they expressed understanding and agree(s) with the plan. Discharge instructions discussed at great length. Strict return precautions discussed and pt &/or family have verbalized understanding of the instructions. No further questions at time of discharge.    Discharge Medication List as of 03/01/2016  8:09 PM    START taking these medications   Details  cyclobenzaprine (FLEXERIL) 10 MG tablet Take 1 tablet (10 mg total) by mouth 2 (two) times daily as needed for muscle spasms., Starting Fri 03/01/2016, Print    HYDROcodone-acetaminophen (NORCO/VICODIN)  5-325 MG tablet Take 1 tablet by mouth every 6 (six) hours as needed., Starting Fri 03/01/2016, Print        Follow Up: [facility] [address] [place] [address] [phone] Schedule an appointment as soon as possible for a visit  If symptoms worsen, please return to the nearest ED.     Canary Brim Tegeler, MD 03/02/16 [phone]

## 2016-07-11 IMAGING — US US OB COMP LESS 14 WK
1 series · 13 of 28 positions shown · non-contrast
Comparison: None.

CLINICAL DATA: Positive pregnancy test.  Lower abdominal pain.

EXAM:
OBSTETRIC <14 WK US AND TRANSVAGINAL OB US
TECHNIQUE: Both transabdominal and transvaginal ultrasound examinations were
performed for complete evaluation of the gestation as well as the
maternal uterus, adnexal regions, and pelvic cul-de-sac.
Transvaginal technique was performed to assess early pregnancy.

[Series 1: us ob comp less 14 wk · 0.24mm/px · 13 of 35 slices shown]
[im 2/35]
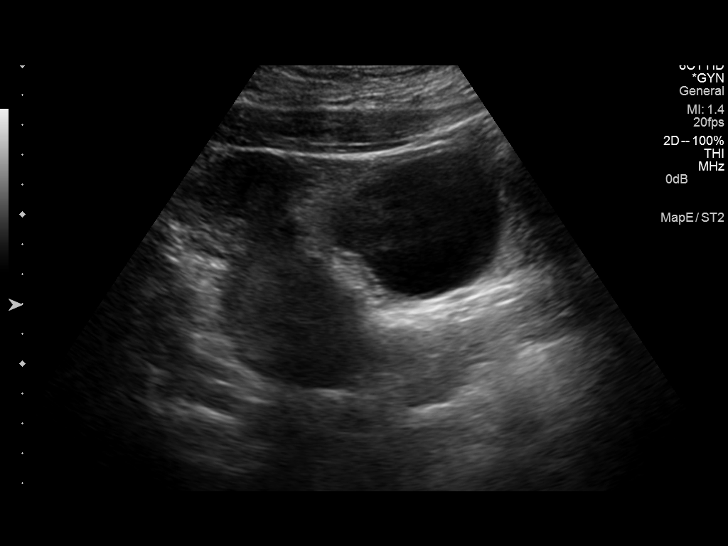
[im 4/35]
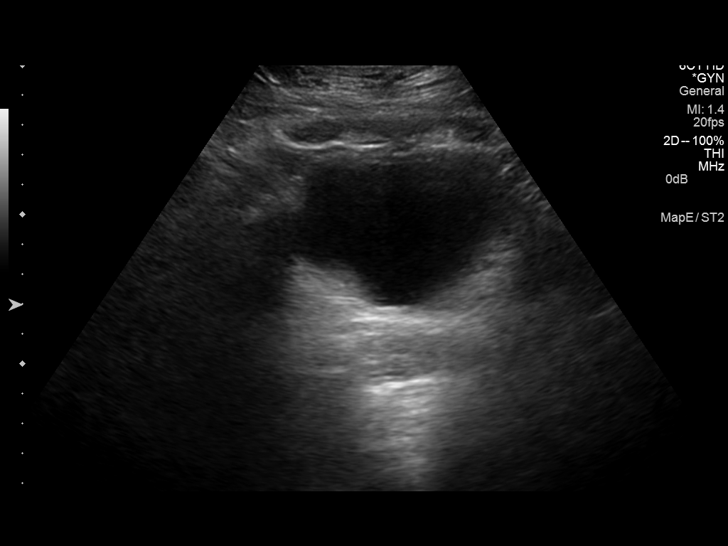
[im 7/35]
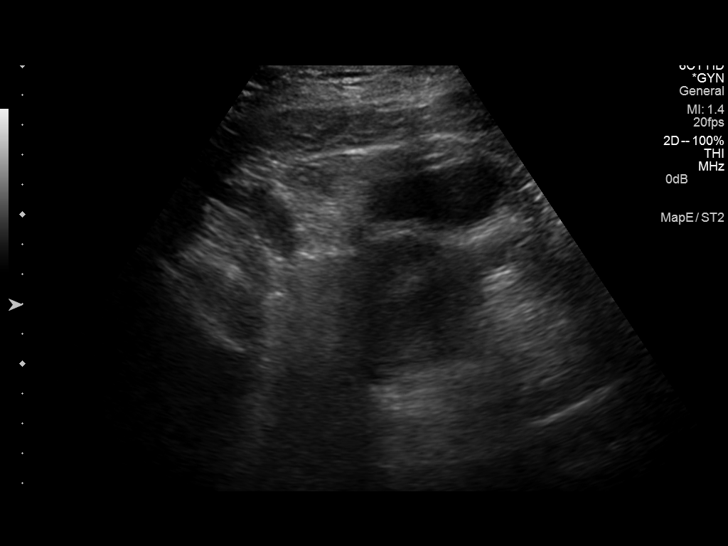
[im 9/35]
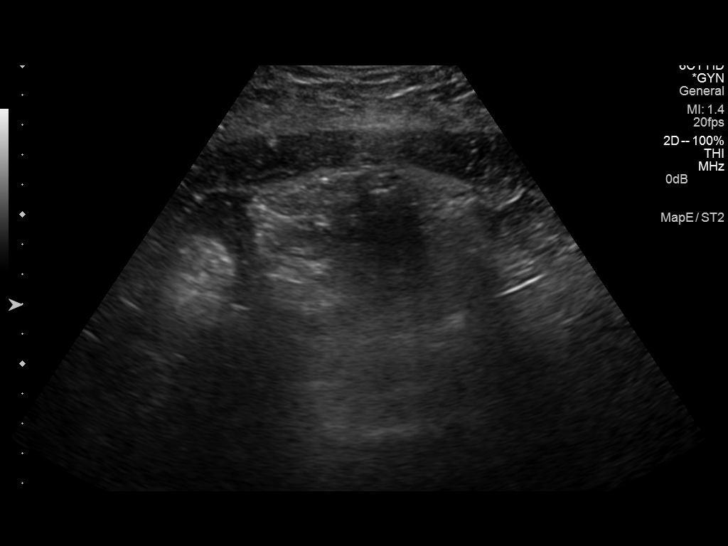
[im 12/35]
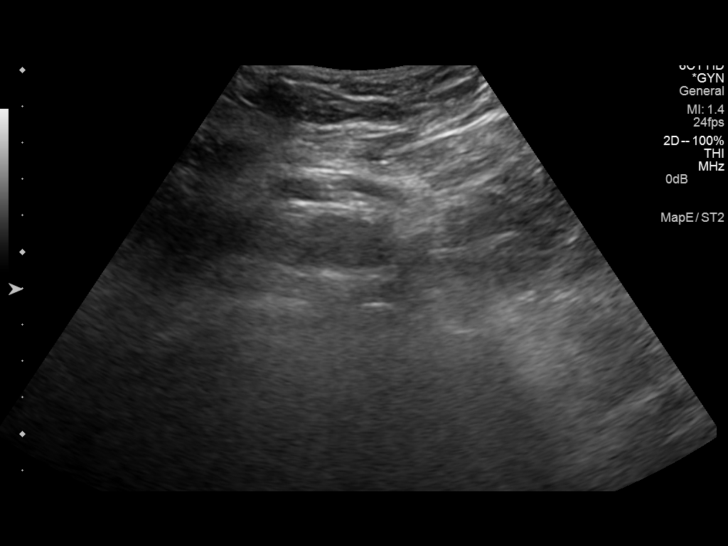
[im 14/35]
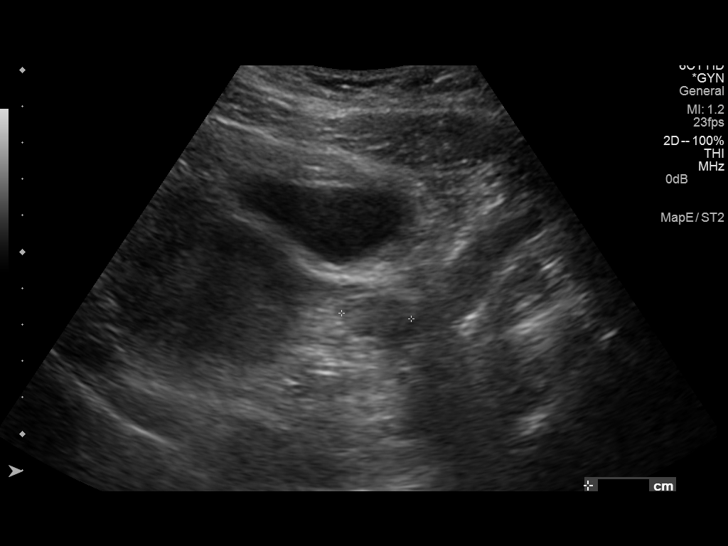
[im 18/35]
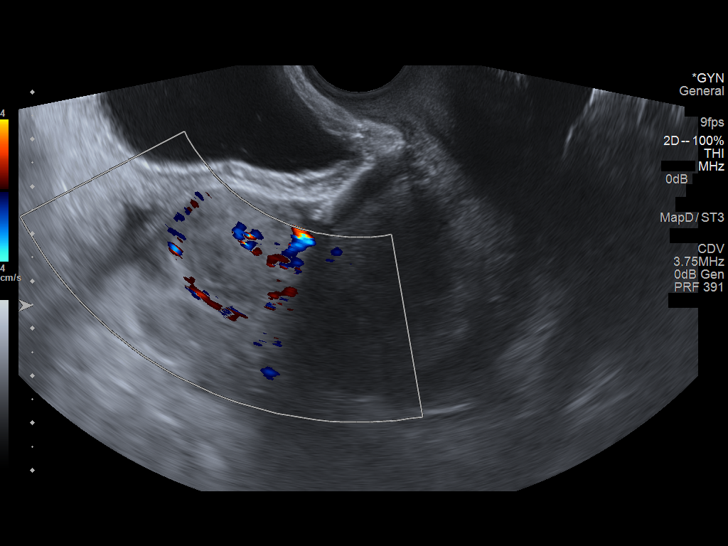
[im 21/35]
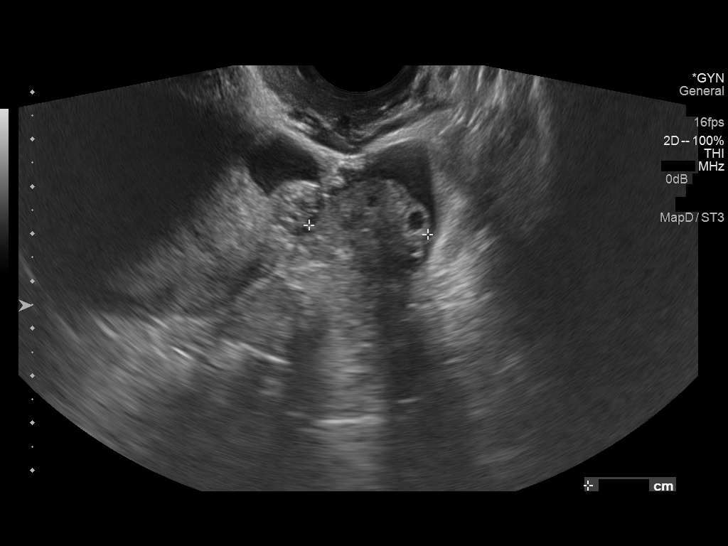
[im 23/35]
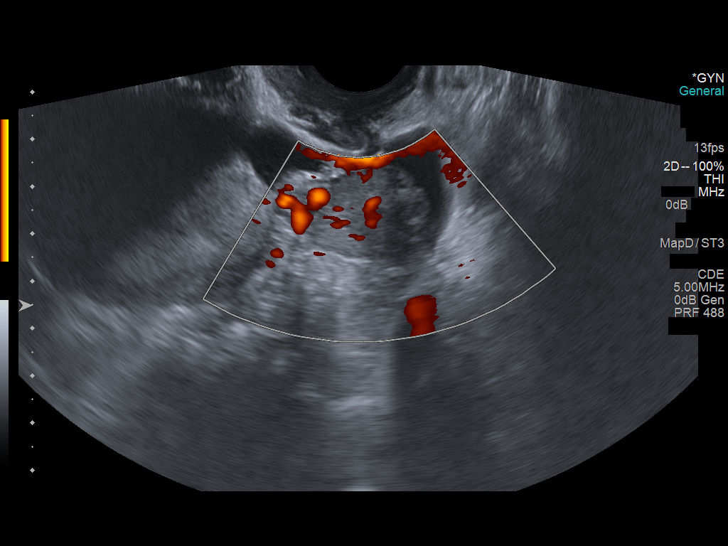
[im 26/35]
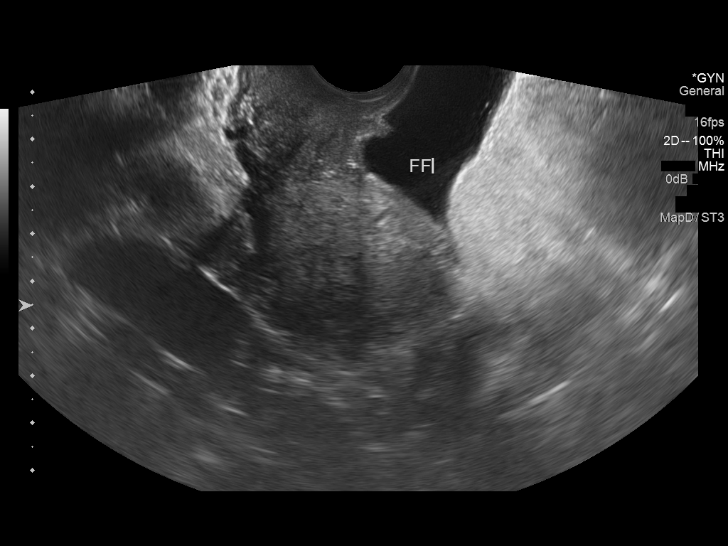
[im 28/35]
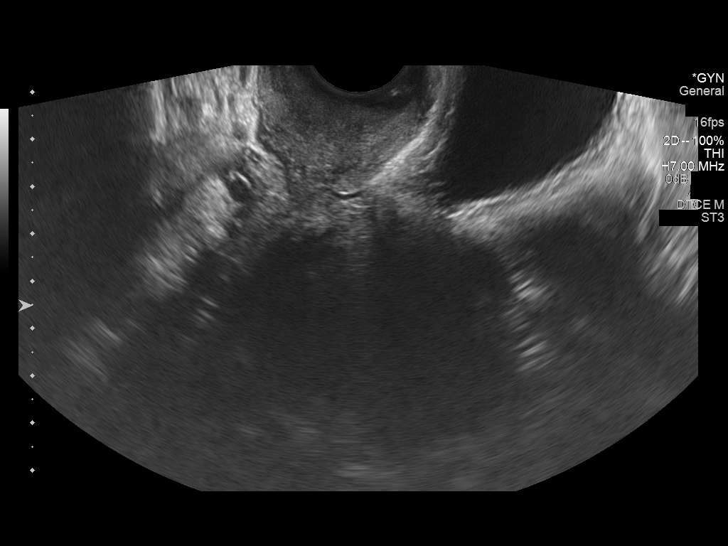
[im 31/35]
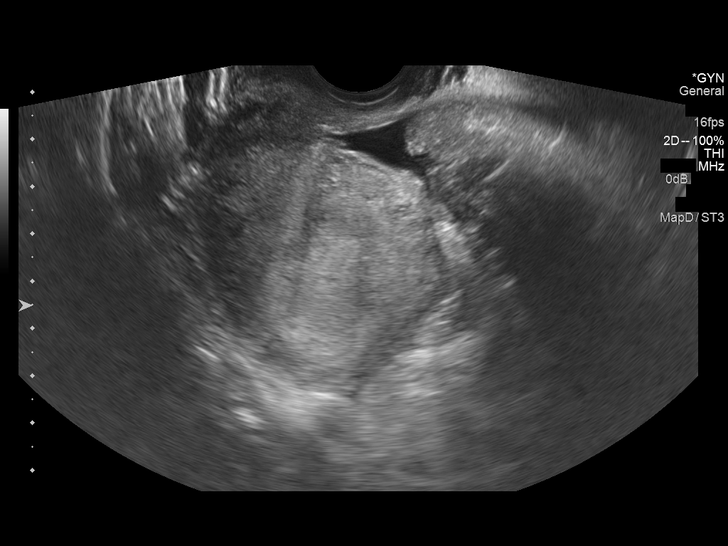
[im 33/35]
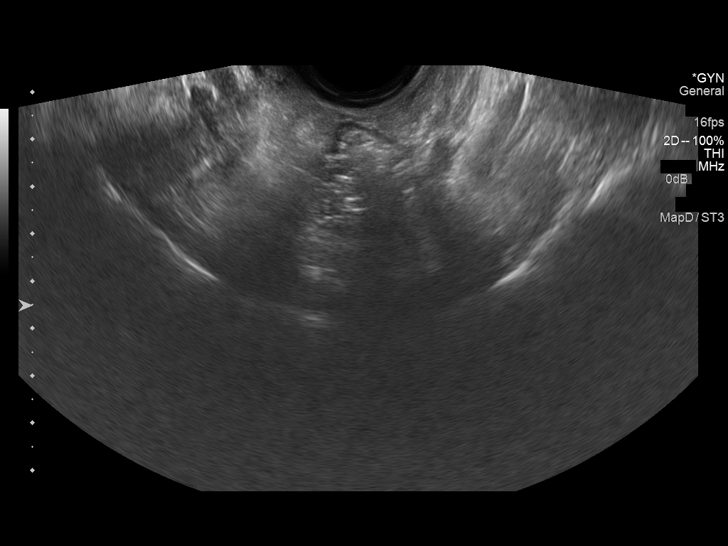

[13 of 28 positions shown; findings below may reference images not displayed]

FINDINGS: Intrauterine gestational sac: None

Yolk sac:  No

Embryo:  No

Cardiac Activity: No

Heart Rate:  No bpm

Maternal uterus/adnexae:

Subchorionic hemorrhage: None

Right ovary: Normal

Left ovary: Normal

Other: Endometrium appears thickened measuring 24.7 mm. Mixed
echogenicity and appearance.

Free fluid: :Moderate amount of free fluid identified within the
pelvis.
IMPRESSION: 1. No intrauterine gestational sac, yolk sac, or fetal pole
identified. Differential considerations include intrauterine
pregnancy too early to be sonographically visualized, missed
abortion, or ectopic pregnancy. Followup ultrasound is recommended
in 10-14 days for further evaluation.
2. Abnormal thickening of the endometrium. In the setting of missed
abortion this could represent retained products. Attention on
followup imaging is advised.
3. Moderate free fluid noted within the pelvis.

## 2016-09-07 ENCOUNTER — Encounter (HOSPITAL_COMMUNITY): Payer: Self-pay

## 2016-09-07 ENCOUNTER — Emergency Department (HOSPITAL_COMMUNITY): Payer: Medicaid Other

## 2016-09-07 ENCOUNTER — Emergency Department (HOSPITAL_COMMUNITY)
Admission: EM | Admit: 2016-09-07 | Discharge: 2016-09-07 | Disposition: A | Discharge status: Home / Self Care | Payer: Medicaid Other | Attending: Emergency Medicine | Admitting: Emergency Medicine

## 2016-09-07 DIAGNOSIS — J45909 Unspecified asthma, uncomplicated: Secondary | ICD-10-CM | POA: Diagnosis not present

## 2016-09-07 DIAGNOSIS — S91331A Puncture wound without foreign body, right foot, initial encounter: Secondary | ICD-10-CM | POA: Diagnosis present

## 2016-09-07 DIAGNOSIS — Y929 Unspecified place or not applicable: Secondary | ICD-10-CM | POA: Diagnosis not present

## 2016-09-07 DIAGNOSIS — Y999 Unspecified external cause status: Secondary | ICD-10-CM | POA: Insufficient documentation

## 2016-09-07 DIAGNOSIS — W228XXA Striking against or struck by other objects, initial encounter: Secondary | ICD-10-CM | POA: Insufficient documentation

## 2016-09-07 DIAGNOSIS — Y939 Activity, unspecified: Secondary | ICD-10-CM | POA: Insufficient documentation

## 2016-09-07 DIAGNOSIS — Z79899 Other long term (current) drug therapy: Secondary | ICD-10-CM | POA: Insufficient documentation

## 2016-09-07 MED ORDER — LIDOCAINE HCL (PF) 1 % IJ SOLN
5.0000 mL | Freq: Once | INTRAMUSCULAR | Status: AC
  Administered 2016-09-07: 5 mL
  Filled 2016-09-07: qty 30

## 2016-09-07 MED ORDER — DICLOFENAC SODIUM 50 MG PO TBEC: 50.0000 mg | DELAYED_RELEASE_TABLET | Freq: Two times a day (BID) | ORAL | 0 refills | Status: AC

## 2016-09-07 MED ORDER — SULFAMETHOXAZOLE-TRIMETHOPRIM 800-160 MG PO TABS: 1.0000 | ORAL_TABLET | Freq: Two times a day (BID) | ORAL | 0 refills | Status: AC

## 2016-09-07 MED ORDER — CEPHALEXIN 500 MG PO CAPS: 500.0000 mg | ORAL_CAPSULE | Freq: Four times a day (QID) | ORAL | 0 refills | Status: AC

## 2016-09-07 MED ORDER — CEPHALEXIN 500 MG PO CAPS
500.0000 mg | ORAL_CAPSULE | Freq: Once | ORAL | Status: AC
  Administered 2016-09-07: 500 mg via ORAL
  Filled 2016-09-07: qty 1

## 2016-09-07 MED ORDER — HYDROCODONE-ACETAMINOPHEN 5-325 MG PO TABS
1.0000 | ORAL_TABLET | Freq: Once | ORAL | Status: AC
  Administered 2016-09-07: 1 via ORAL
  Filled 2016-09-07: qty 1

## 2016-09-07 NOTE — ED Triage Notes (Signed)
Pt states she stepped on something 5 days ago with her RT foot.  States it's gotten progressively more sore and now feels there is pus draining from it.

## 2016-09-07 NOTE — ED Provider Notes (Signed)
[facility]-EMERGENCY DEPT Provider Note   CSN: [id number] Arrival date & time: 09/07/16  1322  By signing my name below, I, Doreatha Martin, attest that this documentation has been prepared under the direction and in the presence of  [facility] M. Damian Leavell, NP. Electronically Signed: Doreatha Martin, ED Scribe. 09/07/16. 1:45 PM.    History   Chief Complaint Chief Complaint  Patient presents with  . Foot Pain    HPI Rhonda Hall is a 21 y.o. female who presents to the Emergency Department complaining of moderate, gradually worsening plantar right foot pain s/p injury that occurred earlier this week. Pt states she stepped on an unknown object while ambulating barefoot on her carpet earlier in the week and has had gradually worsening pain since. She denies foreign body, but states she noticed a small wound. She also denies fall, LOC or head injury.  She states her pain is worsened with weight bearing and ambulation. No alleviating factors noted. Tdap UTD. She denies numbness.    The history is provided by the patient. No language interpreter was used.  Foot Pain  This is a new problem. The current episode started more than 2 days ago. The problem occurs constantly. The problem has been gradually worsening. The symptoms are aggravated by walking. Nothing relieves the symptoms. She has tried nothing for the symptoms. The treatment provided no relief.    Past Medical History:  Diagnosis Date  . Anal or rectal pain   . Asthma   . GERD (gastroesophageal reflux disease)   . Hemorrhoids   . IUGR (intrauterine growth restriction) 01/04/2015  . Obesity   . PCOS (polycystic ovarian syndrome)   . SVD (spontaneous vaginal delivery) 01/05/2015    Patient Active Problem List   Diagnosis Date Noted  . IUGR (intrauterine growth restriction) affecting care of mother 01/05/2015  . SVD (spontaneous vaginal delivery) 01/05/2015  . IUGR (intrauterine growth restriction) 01/04/2015  . Anal or rectal pain   .  Hemorrhoids     Past Surgical History:  Procedure Laterality Date  . NO PAST SURGERIES      OB History    Gravida Para Term Preterm AB Living   SAB TAB Ectopic Multiple Live Births         0 1       Home Medications    Prior to Admission medications   Medication Sig Start Date End Date Taking? Authorizing Provider  albuterol (PROVENTIL HFA;VENTOLIN HFA) 108 (90 BASE) MCG/ACT inhaler Inhale into the lungs every 6 (six) hours as needed for wheezing or shortness of breath.    Historical Provider, MD  benzonatate (TESSALON) 100 MG capsule Take 1 capsule (100 mg total) by mouth every 8 (eight) hours. Patient not taking: Reported on 03/01/2016 10/14/15   Audry Pili, PA-C  cephALEXin (KEFLEX) 500 MG capsule Take 1 capsule (500 mg total) by mouth 4 (four) times daily. 09/07/16   Zamiya Dillard Orlene Och, NP  cyclobenzaprine (FLEXERIL) 10 MG tablet Take 1 tablet (10 mg total) by mouth 2 (two) times daily as needed for muscle spasms. 03/01/16   Canary Brim Tegeler, MD  diclofenac (VOLTAREN) 50 MG EC tablet Take 1 tablet (50 mg total) by mouth 2 (two) times daily. 09/07/16   Bellatrix Devonshire Orlene Och, NP  HYDROcodone-acetaminophen (NORCO/VICODIN) 5-325 MG tablet Take 1 tablet by mouth every 6 (six) hours as needed. 03/01/16   Canary Brim Tegeler, MD  ibuprofen (ADVIL,MOTRIN) 200 MG tablet Take 800  mg by mouth every 6 (six) hours as needed for moderate pain.    Historical Provider, MD  ibuprofen (ADVIL,MOTRIN) 600 MG tablet Take 1 tablet (600 mg total) by mouth every 6 (six) hours as needed. Patient not taking: Reported on 03/01/2016 01/07/15   Tracey Harries, MD  omeprazole (PRILOSEC) 20 MG capsule Take 2 capsules (40 mg total) by mouth daily. Patient not taking: Reported on 03/01/2016 11/14/14   Pricilla Loveless, MD  oxyCODONE-acetaminophen (PERCOCET/ROXICET) 5-325 MG per tablet Take 1 tablet by mouth every 6 (six) hours as needed (for pain scale 4-7). Patient not taking: Reported on 03/01/2016 01/07/15   Tracey Harries, MD  sulfamethoxazole-trimethoprim (BACTRIM DS,SEPTRA DS) 800-160 MG tablet Take 1 tablet by mouth 2 (two) times daily. 09/07/16 09/14/16  Lakeyta Vandenheuvel Orlene Och, NP    Family History Family History  Problem Relation Age of Onset  . Hypertension Mother   . Hypertension Father   . Diabetes Father     Social History Social History  Substance Use Topics  . Smoking status: Never Smoker  . Smokeless tobacco: Never Used  . Alcohol use No     Allergies   Strawberry extract   Review of Systems Review of Systems  Gastrointestinal: Negative for nausea.  Musculoskeletal: Positive for arthralgias.       Right foot pain  Skin: Positive for wound.  Neurological: Negative for numbness.     Physical Exam Updated Vital Signs BP (!) 144/96 (BP Location: Left Arm)   Pulse 87   Temp 98.1 F (36.7 C) (Oral)   Resp 18   Ht 6' (1.829 m)   Wt (!) 171.5 kg   LMP 08/22/2016 (Approximate)   SpO2 100%   BMI 51.27 kg/m   Physical Exam  Constitutional: She appears well-developed and well-nourished.  HENT:  Head: Normocephalic.  Eyes: Conjunctivae are normal.  Cardiovascular: Normal rate.   Pulmonary/Chest: Effort normal. No respiratory distress.  Abdominal: She exhibits no distension.  Musculoskeletal: Normal range of motion. She exhibits tenderness.  Puncture wound to the plantar aspect of the right foot midway that is tender on palpation.   Neurological: She is alert.  Skin: Skin is warm and dry.  Psychiatric: She has a normal mood and affect. Her behavior is normal.  Nursing note and vitals reviewed.    ED Treatments / Results   DIAGNOSTIC STUDIES: Oxygen Saturation is 100% on RA, normal by my interpretation.    COORDINATION OF CARE: 1:37 PM Discussed treatment plan with pt at bedside which includes XR and pt agreed to plan.   Radiology Dg Foot Complete Right  Result Date: 09/07/2016 CLINICAL DATA:  Stepped on something 5 days ago with her RIGHT foot, progressive pain at  plantar surface of RIGHT foot since incidence, drainage from wound EXAM: RIGHT FOOT COMPLETE - 3+ VIEW COMPARISON:  None FINDINGS: Soft tissue swelling RIGHT foot. Osseous mineralization normal. Joint spaces preserved. Spur at distal aspect of the proximal phalanx fifth toe laterally. No acute fracture, dislocation, or bone destruction. No definite radiopaque foreign body or soft tissue gas. IMPRESSION: No acute osseous abnormalities. Electronically Signed   By: Ulyses Southward M.D.   On: 09/07/2016 14:06    Procedures Procedures (including critical care time)  Wound care  Performed by Kerrie Buffalo, NP at 3:16 PM  Risks and benefits discussed. Pt identity confirmed verbally. Consent verbally obtained. I numbed the plantar right foot using 1% lidocaine with a 25g needle via local infiltration 2 ccs. I then made a small incision  with an 11 blade and I did not palpate any foreign body.I did not feel any click when I explored the wound. Wound was thoroughly cleansed by soaking in warm water with antibacterial soap and irrigated with NSS. Sterile dressing applied prior to discharge. Pt tolerated procedure without difficulty.   Medications Ordered in ED Medications  lidocaine (PF) (XYLOCAINE) 1 % injection 5 mL (5 mLs Infiltration Given 09/07/16 1542)  cephALEXin (KEFLEX) capsule 500 mg (500 mg Oral Given 09/07/16 1542)  HYDROcodone-acetaminophen (NORCO/VICODIN) 5-325 MG per tablet 1 tablet (1 tablet Oral Given 09/07/16 1542)     Initial Impression / Assessment and Plan / ED Course  I have reviewed the triage vital signs and the nursing notes.  Pertinent imaging results that were available during my care of the patient were reviewed by me and considered in my medical decision making (see chart for details).   Rhonda Hall presents to the ED for evaluation of plantar right foot pain following an injury earlier in the week. Exam reveals small puncture wound. Tdap UTD. XR right foot negative. Patient will  be sent home with Keflex and Bactrim. As pt was barefoot, and not f/b through rubber bottom there was no concern for Pseudomonas so will treat with Keflex and Bactrim. Conservative therapies discussed and recommended. Patient to soak the foot 3 times a day in warm soapy water and cover the area. Patient advised to follow up with PCP as needed, or with worsening symptoms. Patient appears stable for discharge at this time. Return precautions discussed and outlined in discharge paperwork. Patient is agreeable to plan.     Final Clinical Impressions(s) / ED Diagnoses   Final diagnoses:  Puncture wound of right foot, initial encounter    New Prescriptions Discharge Medication List as of 09/07/2016  3:19 PM    START taking these medications   Details  cephALEXin (KEFLEX) 500 MG capsule Take 1 capsule (500 mg total) by mouth 4 (four) times daily., Starting Sat 09/07/2016, Print    diclofenac (VOLTAREN) 50 MG EC tablet Take 1 tablet (50 mg total) by mouth 2 (two) times daily., Starting Sat 09/07/2016, Print    sulfamethoxazole-trimethoprim (BACTRIM DS,SEPTRA DS) 800-160 MG tablet Take 1 tablet by mouth 2 (two) times daily., Starting Sat 09/07/2016, Until Sat 09/14/2016, Print       I personally performed the services described in this documentation, which was scribed in my presence. The recorded information has been reviewed and is accurate.    [address] [place], [place] 09/07/16 1609    Arby Barrette, MD 09/08/16 [phone]

## 2018-06-30 ENCOUNTER — Other Ambulatory Visit: Payer: Self-pay

## 2018-06-30 ENCOUNTER — Encounter: Payer: Self-pay | Admitting: Emergency Medicine

## 2018-06-30 DIAGNOSIS — Z79899 Other long term (current) drug therapy: Secondary | ICD-10-CM | POA: Diagnosis not present

## 2018-06-30 DIAGNOSIS — J45909 Unspecified asthma, uncomplicated: Secondary | ICD-10-CM | POA: Insufficient documentation

## 2018-06-30 DIAGNOSIS — J111 Influenza due to unidentified influenza virus with other respiratory manifestations: Secondary | ICD-10-CM | POA: Insufficient documentation

## 2018-06-30 DIAGNOSIS — R05 Cough: Secondary | ICD-10-CM | POA: Diagnosis present

## 2018-06-30 NOTE — ED Triage Notes (Signed)
Patient ambulatory to triage with steady gait, without difficulty or distress noted, mask in place; pt reports since last night having prod cough yellow sputum; denies fever, reports body aches

## 2018-07-01 ENCOUNTER — Emergency Department: Payer: Medicaid Other

## 2018-07-01 ENCOUNTER — Emergency Department
Admission: EM | Admit: 2018-07-01 | Discharge: 2018-07-01 | Disposition: A | Discharge status: Home / Self Care | Payer: Medicaid Other | Attending: Emergency Medicine | Admitting: Emergency Medicine

## 2018-07-01 DIAGNOSIS — R69 Illness, unspecified: Secondary | ICD-10-CM

## 2018-07-01 DIAGNOSIS — J111 Influenza due to unidentified influenza virus with other respiratory manifestations: Secondary | ICD-10-CM

## 2018-07-01 LAB — INFLUENZA PANEL BY PCR (TYPE A & B)
Influenza A By PCR: NEGATIVE
Influenza B By PCR: NEGATIVE

## 2018-07-01 MED ORDER — BENZONATATE 100 MG PO CAPS: 100.0000 mg | ORAL_CAPSULE | Freq: Three times a day (TID) | ORAL | 0 refills | Status: AC | PRN

## 2018-07-01 MED ORDER — ALBUTEROL SULFATE HFA 108 (90 BASE) MCG/ACT IN AERS: INHALATION_SPRAY | RESPIRATORY_TRACT | 1 refills | Status: AC

## 2018-07-01 MED ORDER — BENZONATATE 100 MG PO CAPS: 100.0000 mg | ORAL_CAPSULE | Freq: Three times a day (TID) | ORAL | 0 refills | Status: DC | PRN

## 2018-07-01 MED ORDER — ALBUTEROL SULFATE HFA 108 (90 BASE) MCG/ACT IN AERS: INHALATION_SPRAY | RESPIRATORY_TRACT | 1 refills | Status: DC

## 2018-07-01 NOTE — Discharge Instructions (Signed)

## 2018-07-01 NOTE — ED Provider Notes (Signed)
[facility] Emergency Department Provider Note  ____________________________________________   First MD Initiated Contact with Patient 07/01/18 [phone]     (approximate)  I have reviewed the triage vital signs and the nursing notes.   HISTORY  Chief Complaint Cough    HPI Rhonda Hall is a 23 y.o. female with medical issues as listed below which notably includes well-controlled asthma that she says is not giving her trouble for at least the last 4 years.  She presents by private vehicle tonight for evaluation of acute onset over the last 24 hours of a variety of respiratory symptoms including general malaise, myalgias, nasal congestion/runny nose, and cough.  She is not having any difficulty breathing except with exertion which also makes her cough worse.  She did not get an influenza vaccination this year.  Nothing particular makes her symptoms better or worse.  She denies fever, nausea, vomiting, abdominal pain, and dysuria.  Past Medical History:  Diagnosis Date  . Anal or rectal pain   . Asthma   . GERD (gastroesophageal reflux disease)   . Hemorrhoids   . IUGR (intrauterine growth restriction) 01/04/2015  . Obesity   . PCOS (polycystic ovarian syndrome)   . SVD (spontaneous vaginal delivery) 01/05/2015    Patient Active Problem List   Diagnosis Date Noted  . IUGR (intrauterine growth restriction) affecting care of mother 01/05/2015  . SVD (spontaneous vaginal delivery) 01/05/2015  . IUGR (intrauterine growth restriction) 01/04/2015  . Anal or rectal pain   . Hemorrhoids     Past Surgical History:  Procedure Laterality Date  . NO PAST SURGERIES      Prior to Admission medications   Medication Sig Start Date End Date Taking? Authorizing Provider  albuterol (PROVENTIL HFA;VENTOLIN HFA) 108 (90 Base) MCG/ACT inhaler Inhale 2-4 puffs by mouth every 4 hours as needed for wheezing, cough, and/or shortness of breath 07/01/18   Loleta RoseForbach, Tuyen Uncapher, MD    benzonatate (TESSALON PERLES) 100 MG capsule Take 1 capsule (100 mg total) by mouth 3 (three) times daily as needed for cough. 07/01/18   Loleta RoseForbach, Maxie Debose, MD  cephALEXin (KEFLEX) 500 MG capsule Take 1 capsule (500 mg total) by mouth 4 (four) times daily. 09/07/16   Janne NapoleonNeese, Hope M, NP  cyclobenzaprine (FLEXERIL) 10 MG tablet Take 1 tablet (10 mg total) by mouth 2 (two) times daily as needed for muscle spasms. 03/01/16   Tegeler, Canary Brimhristopher J, MD  diclofenac (VOLTAREN) 50 MG EC tablet Take 1 tablet (50 mg total) by mouth 2 (two) times daily. 09/07/16   Janne NapoleonNeese, Hope M, NP  HYDROcodone-acetaminophen (NORCO/VICODIN) 5-325 MG tablet Take 1 tablet by mouth every 6 (six) hours as needed. 03/01/16   Tegeler, Canary Brimhristopher J, MD  ibuprofen (ADVIL,MOTRIN) 200 MG tablet Take 800 mg by mouth every 6 (six) hours as needed for moderate pain.    [provider]  ibuprofen (ADVIL,MOTRIN) 600 MG tablet Take 1 tablet (600 mg total) by mouth every 6 (six) hours as needed. Patient not taking: Reported on 03/01/2016 01/07/15   Tracey HarriesHenley, Thomas, MD  omeprazole (PRILOSEC) 20 MG capsule Take 2 capsules (40 mg total) by mouth daily. Patient not taking: Reported on 03/01/2016 11/14/14   Pricilla LovelessGoldston, Scott, MD  oxyCODONE-acetaminophen (PERCOCET/ROXICET) 5-325 MG per tablet Take 1 tablet by mouth every 6 (six) hours as needed (for pain scale 4-7). Patient not taking: Reported on 03/01/2016 01/07/15   Tracey HarriesHenley, Thomas, MD    Allergies Strawberry extract  Family History  Problem Relation Age of  Onset  . Hypertension Mother   . Hypertension Father   . Diabetes Father     Social History Social History   Tobacco Use  . Smoking status: Never Smoker  . Smokeless tobacco: Never Used  Substance Use Topics  . Alcohol use: No  . Drug use: No    Review of Systems Constitutional: No fever/chills.  Fatigue, myalgias, and general malaise. Eyes: No visual changes. ENT: No sore throat. Cardiovascular: Denies chest pain. Respiratory:  Cough and mild shortness of breath with exertion. Gastrointestinal: No abdominal pain.  No nausea, no vomiting.  No diarrhea.  No constipation. Genitourinary: Negative for dysuria. Musculoskeletal: Negative for neck pain.  Negative for back pain. Integumentary: Negative for rash. Neurological: Negative for headaches, focal weakness or numbness.   ____________________________________________   PHYSICAL EXAM:  VITAL SIGNS: ED Triage Vitals  Enc Vitals Group     BP 06/30/18 2349 137/80     Pulse Rate 06/30/18 2349 (!) 109     Resp 06/30/18 2349 (!) 22     Temp 06/30/18 2349 98.5 F (36.9 C)     Temp Source 06/30/18 2349 Oral     SpO2 06/30/18 2349 98 %     Weight 06/30/18 2355 (!) 165.1 kg (364 lb)     Height 06/30/18 2355 1.829 m (6')     Head Circumference --      Peak Flow --      Pain Score 06/30/18 2355 8     Pain Loc --      Pain Edu? --      Excl. in GC? --     Constitutional: Alert and oriented.  Appears uncomfortable as if from a respiratory infection but is nontoxic and in no acute distress. Eyes: Conjunctivae are normal.  Head: Atraumatic. Nose: +congestion/rhinnorhea. Mouth/Throat: Mucous membranes are moist.  Oropharynx non-erythematous. Neck: No stridor.  No meningeal signs.   Cardiovascular: Mild sinus tachycardia, regular rhythm. Good peripheral circulation. Grossly normal heart sounds. Respiratory: Mildly increased respiratory rate with normal respiratory effort.  No retractions. Lungs CTAB. Gastrointestinal: Morbid obesity.  Soft and nontender. No distention.  Musculoskeletal: No lower extremity tenderness nor edema. No gross deformities of extremities. Neurologic:  Normal speech and language. No gross focal neurologic deficits are appreciated.  Skin:  Skin is warm, dry and intact. No rash noted. Psychiatric: Mood and affect are normal. Speech and behavior are normal.  ____________________________________________   LABS (all labs ordered are listed,  but only abnormal results are displayed)  Labs Reviewed  INFLUENZA PANEL BY PCR (TYPE A & B)   ____________________________________________  EKG  None - EKG not ordered by ED physician ____________________________________________  RADIOLOGY Marylou Mccoy, personally viewed and evaluated these images (plain radiographs) as part of my medical decision making, as well as reviewing the written report by the radiologist.  ED MD interpretation: Likely reactive small airway disease  Official radiology report(s): Dg Chest 2 View  Result Date: 07/01/2018 CLINICAL DATA:  23 year old female with cough EXAM: CHEST - 2 VIEW COMPARISON:  Chest radiograph dated 10/14/2015 FINDINGS: Mild peribronchial streaky densities may represent reactive small airway disease versus viral infection. Clinical correlation is recommended. No focal consolidation, pleural effusion, or pneumothorax. The cardiac silhouette is within normal limits. No acute osseous pathology. IMPRESSION: No focal consolidation. Findings may represent reactive small airway disease versus viral infection. Electronically Signed   By: Elgie Collard M.D.   On: 07/01/2018 00:17    ____________________________________________   PROCEDURES  Critical Care performed: No  Procedure(s) performed:   Procedures   ____________________________________________   INITIAL IMPRESSION / ASSESSMENT AND PLAN / ED COURSE  As part of my medical decision making, I reviewed the following data within the electronic MEDICAL RECORD NUMBER Nursing notes reviewed and incorporated, Labs reviewed , Radiograph reviewed  and Notes from prior ED visits    The patient has signs and symptoms consistent with an influenza-like illness.  The influenza panel obtained in triage was negative but all her symptoms are consistent.  No evidence of pneumonia on chest x-ray.  Her physical exam is generally reassuring.  I had my usual customary respiratory virus management  discussion with the patient and gave my usual return precautions.  She understands and agrees with the plan.     ____________________________________________  FINAL CLINICAL IMPRESSION(S) / ED DIAGNOSES  Final diagnoses:  Influenza-like illness     MEDICATIONS GIVEN DURING THIS VISIT:  Medications - No data to display   ED Discharge Orders         Ordered    albuterol (PROVENTIL HFA;VENTOLIN HFA) 108 (90 Base) MCG/ACT inhaler     07/01/18 0244    benzonatate (TESSALON PERLES) 100 MG capsule  3 times daily PRN     07/01/18 0244           Note:  This document was prepared using Dragon voice recognition software and may include unintentional dictation errors.    Loleta RoseForbach, Meadow Abramo, MD 07/01/18 [phone]

## 2020-09-06 IMAGING — CR DG CHEST 2V
1 series · 2 of 2 positions shown · non-contrast
Comparison: Chest radiograph dated 10/14/2015

CLINICAL DATA: 22-year-old female with cough

EXAM:
CHEST - 2 VIEW

[Series 1: dg chest 2 view · 0.14mm/px · 2 of 2 slices shown]
[im 1/2]
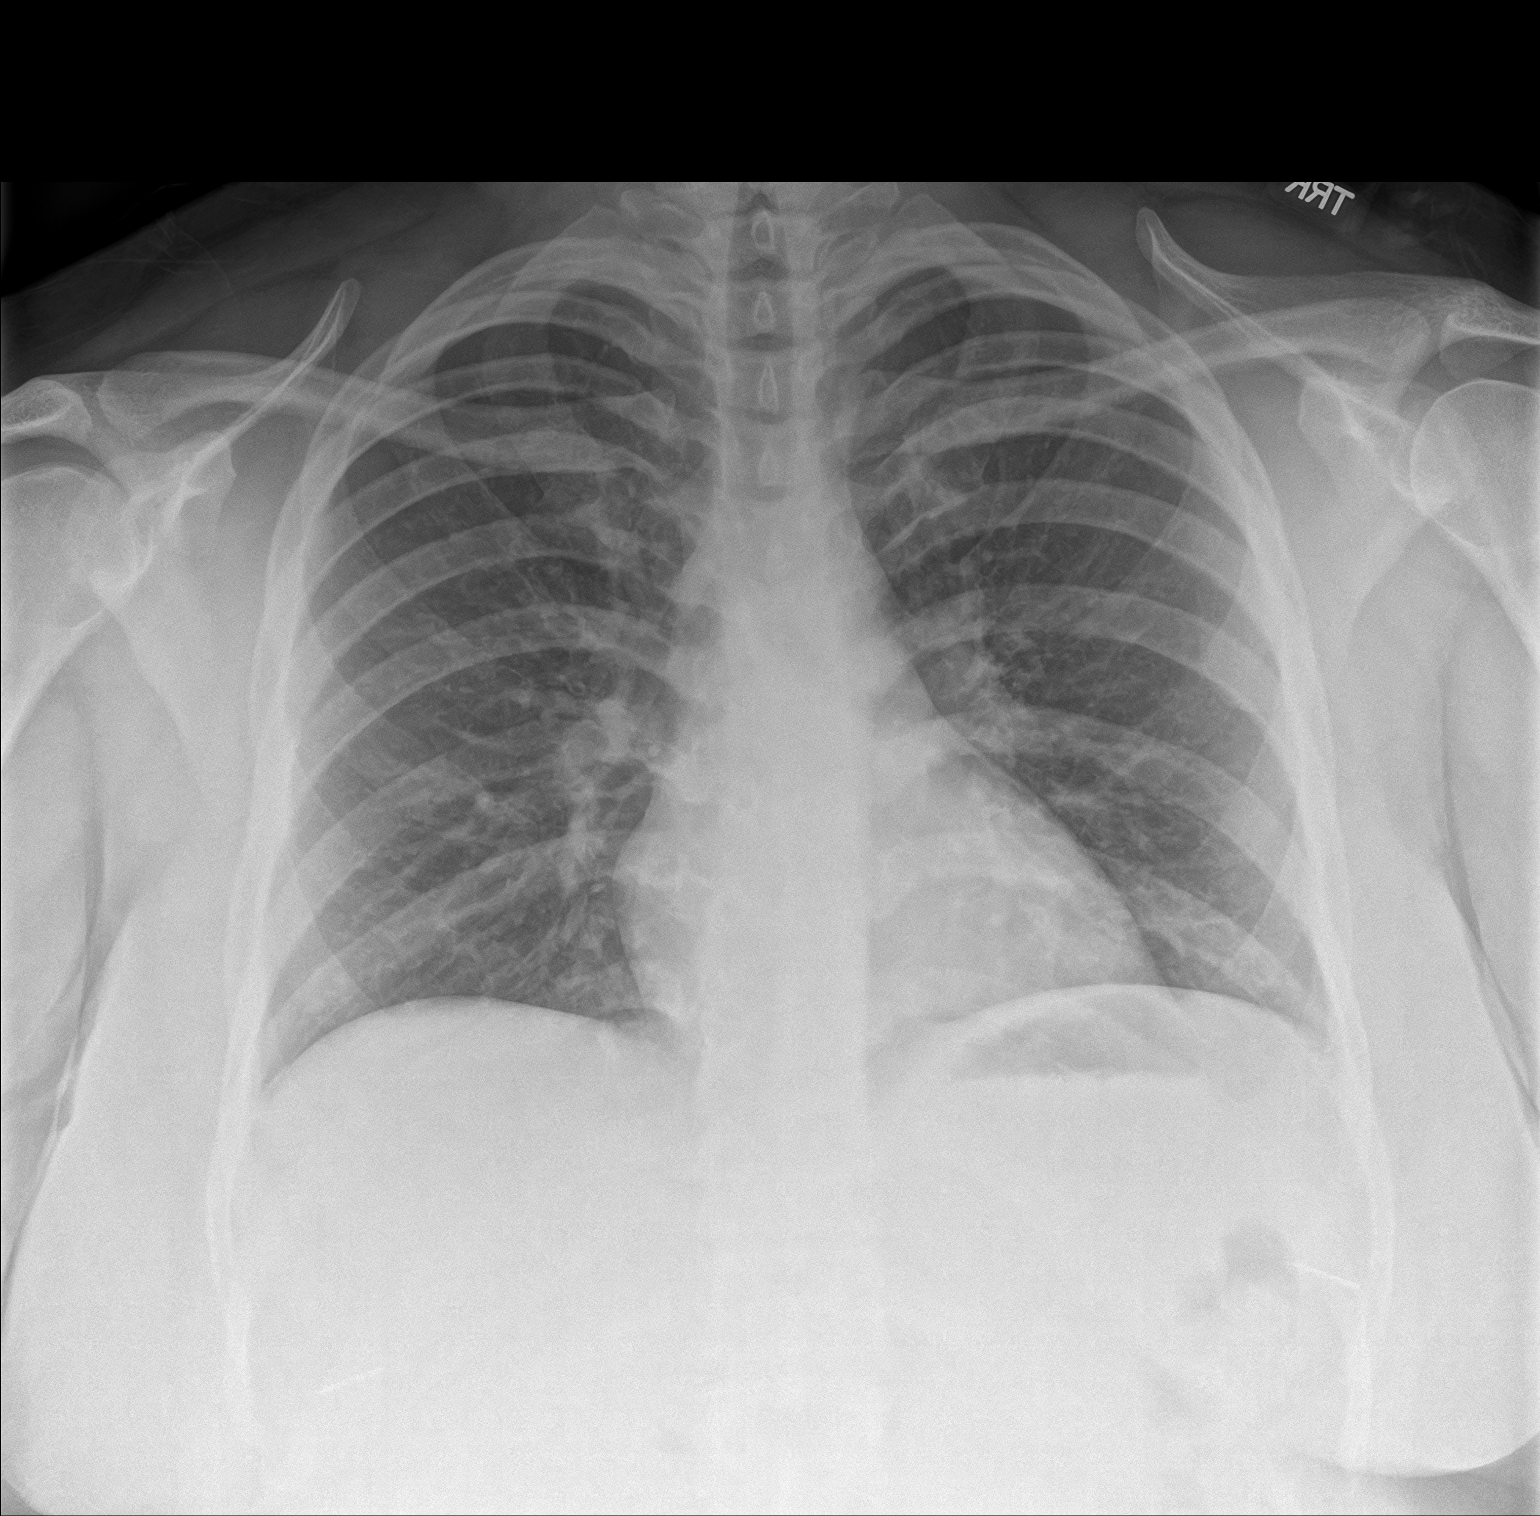
[im 2/2]
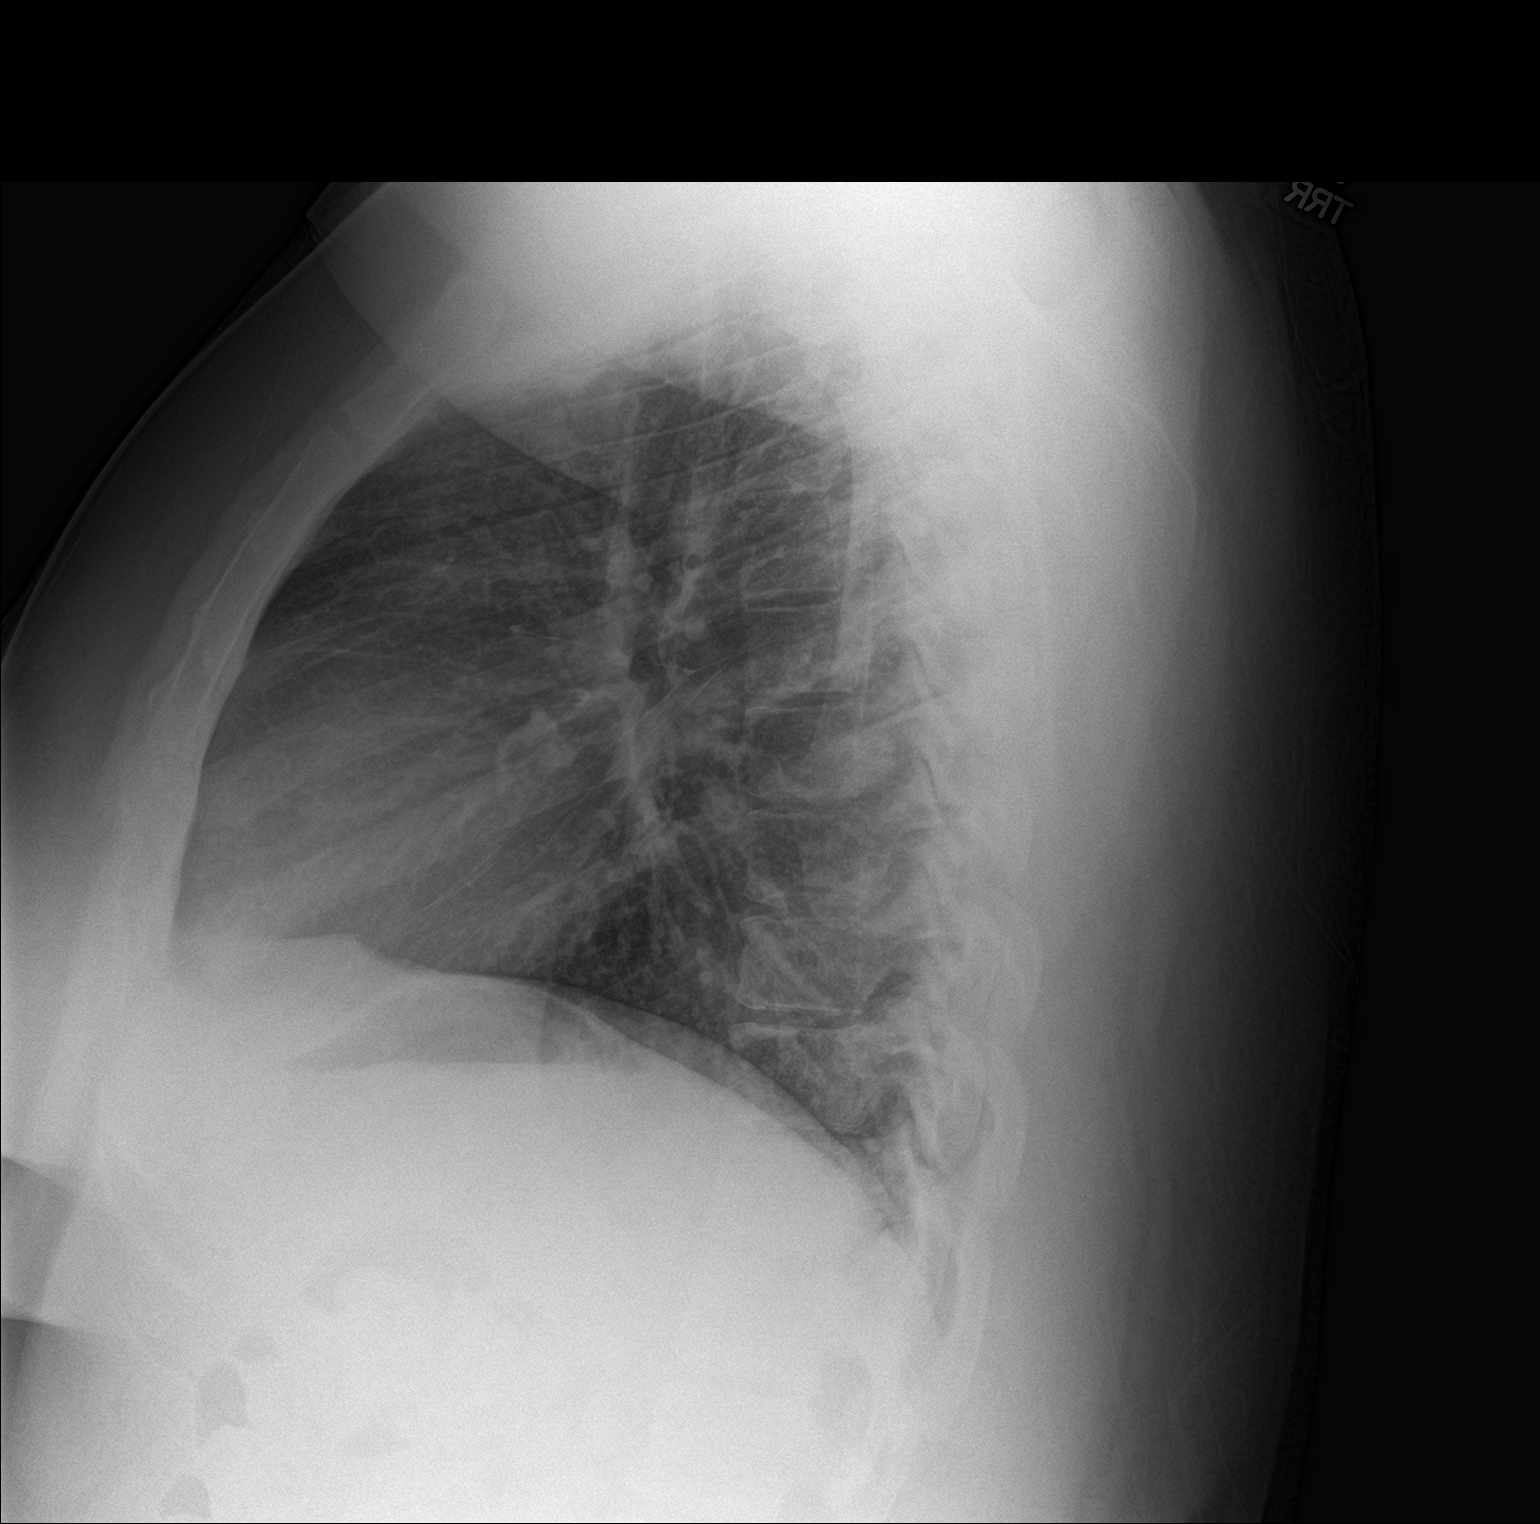

[2 of 2 positions shown; findings below may reference images not displayed]

FINDINGS: Mild peribronchial streaky densities may represent reactive small
airway disease versus viral infection. Clinical correlation is
recommended. No focal consolidation, pleural effusion, or
pneumothorax. The cardiac silhouette is within normal limits. No
acute osseous pathology.
IMPRESSION: No focal consolidation. Findings may represent reactive small airway
disease versus viral infection.
